# Patient Record
Sex: Female | Born: 1945
Health system: Southern US, Community
[De-identification: ages and names within clinical notes are randomized; demographics above are authoritative.]

## PROBLEM LIST (undated history)

## (undated) DIAGNOSIS — K219 Gastro-esophageal reflux disease without esophagitis: Secondary | ICD-10-CM

## (undated) DIAGNOSIS — I10 Essential (primary) hypertension: Secondary | ICD-10-CM

## (undated) DIAGNOSIS — E785 Hyperlipidemia, unspecified: Secondary | ICD-10-CM

## (undated) DIAGNOSIS — E039 Hypothyroidism, unspecified: Secondary | ICD-10-CM

## (undated) DIAGNOSIS — Z9889 Other specified postprocedural states: Secondary | ICD-10-CM

## (undated) DIAGNOSIS — R112 Nausea with vomiting, unspecified: Secondary | ICD-10-CM

## (undated) HISTORY — PX: CHOLECYSTECTOMY: SHX55

## (undated) HISTORY — PX: NASAL SEPTUM SURGERY: SHX37

---

## 1998-05-05 ENCOUNTER — Other Ambulatory Visit: Admission: RE | Admit: 1998-05-05 | Discharge: 1998-05-05 | Payer: Self-pay | Admitting: *Deleted

## 2000-06-01 ENCOUNTER — Encounter: Admission: RE | Admit: 2000-06-01 | Discharge: 2000-06-01 | Payer: Self-pay | Admitting: Internal Medicine

## 2000-06-01 ENCOUNTER — Encounter: Payer: Self-pay | Admitting: Internal Medicine

## 2000-12-21 ENCOUNTER — Encounter: Admission: RE | Admit: 2000-12-21 | Discharge: 2000-12-21 | Payer: Self-pay | Admitting: Internal Medicine

## 2000-12-21 ENCOUNTER — Encounter: Payer: Self-pay | Admitting: Internal Medicine

## 2001-05-23 ENCOUNTER — Encounter: Admission: RE | Admit: 2001-05-23 | Discharge: 2001-08-21 | Payer: Self-pay | Admitting: Internal Medicine

## 2001-06-05 ENCOUNTER — Encounter: Payer: Self-pay | Admitting: Internal Medicine

## 2001-06-05 ENCOUNTER — Encounter: Admission: RE | Admit: 2001-06-05 | Discharge: 2001-06-05 | Payer: Self-pay | Admitting: Internal Medicine

## 2002-06-07 ENCOUNTER — Encounter: Admission: RE | Admit: 2002-06-07 | Discharge: 2002-06-07 | Payer: Self-pay | Admitting: Internal Medicine

## 2002-06-07 ENCOUNTER — Encounter: Payer: Self-pay | Admitting: Internal Medicine

## 2003-02-10 ENCOUNTER — Other Ambulatory Visit: Admission: RE | Admit: 2003-02-10 | Discharge: 2003-02-10 | Payer: Self-pay | Admitting: Internal Medicine

## 2003-07-29 ENCOUNTER — Encounter: Admission: RE | Admit: 2003-07-29 | Discharge: 2003-07-29 | Payer: Self-pay | Admitting: Internal Medicine

## 2004-09-21 ENCOUNTER — Ambulatory Visit (HOSPITAL_COMMUNITY): Admission: RE | Admit: 2004-09-21 | Discharge: 2004-09-21 | Payer: Self-pay | Admitting: Gastroenterology

## 2004-10-04 ENCOUNTER — Encounter: Admission: RE | Admit: 2004-10-04 | Discharge: 2004-10-04 | Payer: Self-pay | Admitting: Internal Medicine

## 2004-10-06 ENCOUNTER — Other Ambulatory Visit: Admission: RE | Admit: 2004-10-06 | Discharge: 2004-10-06 | Payer: Self-pay | Admitting: Internal Medicine

## 2005-10-03 ENCOUNTER — Other Ambulatory Visit: Admission: RE | Admit: 2005-10-03 | Discharge: 2005-10-03 | Payer: Self-pay | Admitting: Internal Medicine

## 2006-01-06 ENCOUNTER — Encounter: Admission: RE | Admit: 2006-01-06 | Discharge: 2006-01-06 | Payer: Self-pay | Admitting: Internal Medicine

## 2007-05-02 ENCOUNTER — Encounter: Admission: RE | Admit: 2007-05-02 | Discharge: 2007-05-02 | Payer: Self-pay | Admitting: *Deleted

## 2008-01-11 ENCOUNTER — Other Ambulatory Visit: Admission: RE | Admit: 2008-01-11 | Discharge: 2008-01-11 | Payer: Self-pay | Admitting: Internal Medicine

## 2008-05-09 ENCOUNTER — Encounter: Admission: RE | Admit: 2008-05-09 | Discharge: 2008-05-09 | Payer: Self-pay | Admitting: Internal Medicine

## 2009-06-11 ENCOUNTER — Encounter: Admission: RE | Admit: 2009-06-11 | Discharge: 2009-06-11 | Payer: Self-pay | Admitting: Internal Medicine

## 2009-10-08 ENCOUNTER — Observation Stay (HOSPITAL_COMMUNITY): Admission: EM | Admit: 2009-10-08 | Discharge: 2009-10-11 | Payer: Self-pay | Admitting: Emergency Medicine

## 2010-08-15 ENCOUNTER — Encounter: Payer: Self-pay | Admitting: Internal Medicine

## 2010-08-20 ENCOUNTER — Other Ambulatory Visit: Payer: Self-pay | Admitting: Internal Medicine

## 2010-08-20 ENCOUNTER — Other Ambulatory Visit (HOSPITAL_COMMUNITY)
Admission: RE | Admit: 2010-08-20 | Discharge: 2010-08-20 | Disposition: A | Discharge status: Home / Self Care | Payer: 59 | Source: Ambulatory Visit | Attending: Family Medicine | Admitting: Family Medicine

## 2010-08-20 DIAGNOSIS — Z01419 Encounter for gynecological examination (general) (routine) without abnormal findings: Secondary | ICD-10-CM | POA: Insufficient documentation

## 2010-08-30 ENCOUNTER — Other Ambulatory Visit (HOSPITAL_COMMUNITY)
Admission: RE | Admit: 2010-08-30 | Discharge: 2010-08-30 | Disposition: A | Discharge status: Home / Self Care | Payer: Self-pay | Source: Ambulatory Visit | Attending: Internal Medicine | Admitting: Internal Medicine

## 2010-08-30 DIAGNOSIS — Z1159 Encounter for screening for other viral diseases: Secondary | ICD-10-CM | POA: Insufficient documentation

## 2010-10-18 LAB — GLUCOSE, CAPILLARY
Glucose-Capillary: 144 mg/dL — ABNORMAL HIGH (ref 70–99)
Glucose-Capillary: 144 mg/dL — ABNORMAL HIGH (ref 70–99)
Glucose-Capillary: 146 mg/dL — ABNORMAL HIGH (ref 70–99)
Glucose-Capillary: 162 mg/dL — ABNORMAL HIGH (ref 70–99)
Glucose-Capillary: 221 mg/dL — ABNORMAL HIGH (ref 70–99)

## 2010-10-18 LAB — LIPID PANEL
HDL: 32 mg/dL — ABNORMAL LOW
Total CHOL/HDL Ratio: 5.4 ratio
Triglycerides: 205 mg/dL — ABNORMAL HIGH
VLDL: 41 mg/dL — ABNORMAL HIGH (ref 0–40)

## 2010-10-18 LAB — FOLATE: Folate: >20 ng/mL

## 2010-10-18 LAB — COMPREHENSIVE METABOLIC PANEL WITH GFR
ALT: 15 U/L (ref 0–35)
ALT: 16 U/L (ref 0–35)
AST: 14 U/L (ref 0–37)
AST: 18 U/L (ref 0–37)
Albumin: 3.2 g/dL — ABNORMAL LOW (ref 3.5–5.2)
Albumin: 4 g/dL (ref 3.5–5.2)
Alkaline Phosphatase: 61 U/L (ref 39–117)
Alkaline Phosphatase: 79 U/L (ref 39–117)
BUN: 41 mg/dL — ABNORMAL HIGH (ref 6–23)
BUN: 47 mg/dL — ABNORMAL HIGH (ref 6–23)
CO2: 19 meq/L (ref 19–32)
CO2: 22 meq/L (ref 19–32)
Calcium: 8.7 mg/dL (ref 8.4–10.5)
Calcium: 9.7 mg/dL (ref 8.4–10.5)
Chloride: 105 meq/L (ref 96–112)
Chloride: 97 meq/L (ref 96–112)
Creatinine, Ser: 2.05 mg/dL — ABNORMAL HIGH (ref 0.4–1.2)
Creatinine, Ser: 2.67 mg/dL — ABNORMAL HIGH (ref 0.4–1.2)
GFR calc non Af Amer: 18 mL/min — ABNORMAL LOW
GFR calc non Af Amer: 24 mL/min — ABNORMAL LOW
Glucose, Bld: 130 mg/dL — ABNORMAL HIGH (ref 70–99)
Glucose, Bld: 224 mg/dL — ABNORMAL HIGH (ref 70–99)
Potassium: 4.1 meq/L (ref 3.5–5.1)
Potassium: 5 meq/L (ref 3.5–5.1)
Sodium: 131 meq/L — ABNORMAL LOW (ref 135–145)
Sodium: 136 meq/L (ref 135–145)
Total Bilirubin: 0.6 mg/dL (ref 0.3–1.2)
Total Bilirubin: 0.7 mg/dL (ref 0.3–1.2)
Total Protein: 6.5 g/dL (ref 6.0–8.3)
Total Protein: 8.1 g/dL (ref 6.0–8.3)

## 2010-10-18 LAB — CBC
HCT: 30.5 % — ABNORMAL LOW (ref 36.0–46.0)
HCT: 34.1 % — ABNORMAL LOW (ref 36.0–46.0)
Hemoglobin: 10.5 g/dL — ABNORMAL LOW (ref 12.0–15.0)
Hemoglobin: 11.7 g/dL — ABNORMAL LOW (ref 12.0–15.0)
MCHC: 34.3 g/dL (ref 30.0–36.0)
MCHC: 34.4 g/dL (ref 30.0–36.0)
MCV: 89.6 fL (ref 78.0–100.0)
MCV: 89.7 fL (ref 78.0–100.0)
Platelets: 221 K/uL (ref 150–400)
RBC: 3.41 MIL/uL — ABNORMAL LOW (ref 3.87–5.11)
RBC: 3.8 MIL/uL — ABNORMAL LOW (ref 3.87–5.11)
RDW: 13.5 % (ref 11.5–15.5)
RDW: 13.5 % (ref 11.5–15.5)
WBC: 7.8 K/uL (ref 4.0–10.5)
WBC: 9.9 10*3/uL (ref 4.0–10.5)

## 2010-10-18 LAB — FERRITIN: Ferritin: 191 ng/mL (ref 10–291)

## 2010-10-18 LAB — BASIC METABOLIC PANEL
CO2: 23 mEq/L (ref 19–32)
Chloride: 105 mEq/L (ref 96–112)
Glucose, Bld: 163 mg/dL — ABNORMAL HIGH (ref 70–99)
Potassium: 4.6 mEq/L (ref 3.5–5.1)
Sodium: 137 mEq/L (ref 135–145)

## 2010-10-18 LAB — URINE CULTURE: Colony Count: >=100000

## 2010-10-18 LAB — RETICULOCYTES
RBC.: 3.58 MIL/uL — ABNORMAL LOW (ref 3.87–5.11)
Retic Count, Absolute: 25.1 K/uL (ref 19.0–186.0)
Retic Ct Pct: 0.7 % (ref 0.4–3.1)

## 2010-10-18 LAB — URINE MICROSCOPIC-ADD ON

## 2010-10-18 LAB — HEMOGLOBIN A1C
Hgb A1c MFr Bld: 8.2 % — ABNORMAL HIGH (ref 4.6–6.1)
Mean Plasma Glucose: 189 mg/dL

## 2010-10-18 LAB — URINALYSIS, ROUTINE W REFLEX MICROSCOPIC
Specific Gravity, Urine: 1.016 (ref 1.005–1.030)
Urobilinogen, UA: 0.2 mg/dL (ref 0.0–1.0)

## 2010-10-18 LAB — DIFFERENTIAL
Lymphs Abs: 1.5 10*3/uL (ref 0.7–4.0)
Neutro Abs: 7.4 10*3/uL (ref 1.7–7.7)

## 2010-10-18 LAB — IRON AND TIBC
Iron: 35 ug/dL — ABNORMAL LOW (ref 42–135)
Saturation Ratios: 12 % — ABNORMAL LOW (ref 20–55)
TIBC: 284 ug/dL (ref 250–470)
UIBC: 249 ug/dL

## 2010-10-18 LAB — VITAMIN B12: Vitamin B-12: 273 pg/mL (ref 211–911)

## 2010-12-10 NOTE — Op Note (Signed)
NAMEKERSTON, LANDECK               ACCOUNT NO.:  [id number]   MEDICAL RECORD NO.:  [id number]          PATIENT TYPE:  AMB   LOCATION:  ENDO                         FACILITY:  [facility]   PHYSICIAN:  Petra Kuba, M.D.    DATE OF BIRTH:  [birth year]   DATE OF PROCEDURE:  09/21/2004  DATE OF DISCHARGE:                                 OPERATIVE REPORT   PROCEDURE:  Colonoscopy.   INDICATIONS FOR PROCEDURE:  Screening.   CONSENT:  Consent was sighed after the risks, benefits, methods, and options  were thoroughly discussed in the office.   MEDICATIONS GIVEN:  Demerol 80, Versed 7.   DESCRIPTION OF PROCEDURE:  Rectal inspection was pertinent for external  hemorrhoids, small.  Digital exam was negative.  The videocolonoscope was  inserted and easily advanced around the colon to the cecum.  This did  require some abdominal pressure but no position changes.  The cecum was  identified by the appendiceal orifice and the ileocecal valve.  No  abnormality was seen on insertion.  The scope was slowly withdrawn.  On slow  withdrawal through the colon, the prep was adequate.  There was some liquid  stool that required washing and suctioning, but on slow withdrawal back to  the rectum, no abnormalities were seen.  Anorectal pullthrough and  retroflexion confirmed some small hemorrhoids.  The scope was straightened,  air was suctioned, and the scope removed.  The patient tolerated the  procedure well.  There were no obvious immediate complications.   ENDOSCOPIC DIAGNOSES:  1.  Internal and external hemorrhoids.  2.  Otherwise within normal limits to the cecum.   PLAN:  Happy to see back p.r.n.  Yearly rectals and guaiacs per Dr. Daphine Deutscher.  Repeat screening in 5 to 10 years.      MEM/MEDQ  D:  09/21/2004  T:  09/21/2004  Job:  [id number]   cc:   Darius Bump, M.D.  [place]  [place] [address]  Fax: [phone]

## 2011-01-18 ENCOUNTER — Other Ambulatory Visit: Payer: Self-pay | Admitting: Internal Medicine

## 2011-01-18 DIAGNOSIS — Z1231 Encounter for screening mammogram for malignant neoplasm of breast: Secondary | ICD-10-CM

## 2011-01-27 ENCOUNTER — Ambulatory Visit
Admission: RE | Admit: 2011-01-27 | Discharge: 2011-01-27 | Disposition: A | Discharge status: Home / Self Care | Payer: 59 | Source: Ambulatory Visit | Attending: Internal Medicine | Admitting: Internal Medicine

## 2011-01-27 DIAGNOSIS — Z1231 Encounter for screening mammogram for malignant neoplasm of breast: Secondary | ICD-10-CM

## 2011-05-16 ENCOUNTER — Other Ambulatory Visit: Payer: Self-pay | Admitting: Orthopaedic Surgery

## 2011-05-16 DIAGNOSIS — M545 Low back pain: Secondary | ICD-10-CM

## 2011-05-16 DIAGNOSIS — M79605 Pain in left leg: Secondary | ICD-10-CM

## 2011-05-17 ENCOUNTER — Ambulatory Visit
Admission: RE | Admit: 2011-05-17 | Discharge: 2011-05-17 | Disposition: A | Discharge status: Home / Self Care | Payer: 59 | Source: Ambulatory Visit | Attending: Orthopaedic Surgery | Admitting: Orthopaedic Surgery

## 2011-05-17 DIAGNOSIS — M79605 Pain in left leg: Secondary | ICD-10-CM

## 2011-05-17 DIAGNOSIS — M545 Low back pain: Secondary | ICD-10-CM

## 2011-05-31 ENCOUNTER — Encounter (HOSPITAL_COMMUNITY): Payer: Self-pay

## 2011-06-01 ENCOUNTER — Encounter (HOSPITAL_COMMUNITY): Payer: Self-pay | Admitting: Pharmacy Technician

## 2011-06-01 ENCOUNTER — Encounter (HOSPITAL_COMMUNITY): Payer: Self-pay

## 2011-06-01 ENCOUNTER — Encounter (HOSPITAL_COMMUNITY)
Admission: RE | Admit: 2011-06-01 | Discharge: 2011-06-01 | Disposition: A | Discharge status: Home / Self Care | Payer: 59 | Source: Ambulatory Visit | Attending: Neurosurgery | Admitting: Neurosurgery

## 2011-06-01 ENCOUNTER — Other Ambulatory Visit: Payer: Self-pay

## 2011-06-01 ENCOUNTER — Ambulatory Visit (HOSPITAL_COMMUNITY)
Admission: RE | Admit: 2011-06-01 | Discharge: 2011-06-01 | Disposition: A | Discharge status: Home / Self Care | Payer: 59 | Source: Ambulatory Visit | Attending: Neurosurgery | Admitting: Neurosurgery

## 2011-06-01 HISTORY — DX: Hypothyroidism, unspecified: E03.9

## 2011-06-01 HISTORY — DX: Nausea with vomiting, unspecified: R11.2

## 2011-06-01 HISTORY — DX: Gastro-esophageal reflux disease without esophagitis: K21.9

## 2011-06-01 HISTORY — DX: Essential (primary) hypertension: I10

## 2011-06-01 HISTORY — DX: Hyperlipidemia, unspecified: E78.5

## 2011-06-01 HISTORY — DX: Other specified postprocedural states: Z98.890

## 2011-06-01 LAB — BASIC METABOLIC PANEL
CO2: 24 mEq/L (ref 19–32)
Calcium: 10.3 mg/dL (ref 8.4–10.5)
Creatinine, Ser: 0.77 mg/dL (ref 0.50–1.10)

## 2011-06-01 LAB — CBC
MCV: 91.5 fL (ref 78.0–100.0)
Platelets: 269 10*3/uL (ref 150–400)
RDW: 13.6 % (ref 11.5–15.5)
WBC: 6.3 10*3/uL (ref 4.0–10.5)

## 2011-06-01 LAB — TYPE AND SCREEN

## 2011-06-01 MED ORDER — CEFAZOLIN SODIUM 1-5 GM-% IV SOLN
1.0000 g | INTRAVENOUS | Status: DC
  Filled 2011-06-01: qty 50

## 2011-06-01 NOTE — Pre-Procedure Instructions (Signed)
20 Belina A Jorstad  06/01/2011   Your procedure is scheduled on:  Jun 02, 2011 Thursday  Report to [facility] at 1030 AM.  Call this number if you have problems the morning of surgery: [phone]   Remember:   Do not eat food:After Midnight.  Do not drink clear liquids: 4 Hours before arrival.  Take these medicines the morning of surgery with A SIP OF WATER: atenolol, hydrocodone, levothyroxin   Do not wear jewelry, make-up or nail polish.  Do not wear lotions, powders, or perfumes. You may wear deodorant.  Do not shave 48 hours prior to surgery.  Do not bring valuables to the hospital.  Contacts, dentures or bridgework may not be worn into surgery.  Leave suitcase in the car. After surgery it may be brought to your room.  For patients admitted to the hospital, checkout time is 11:00 AM the day of discharge.   Patients discharged the day of surgery will not be allowed to drive home.  Name and phone number of your driver: NA  Special Instructions: CHG Shower Use Special Wash: 1/2 bottle night before surgery and 1/2 bottle morning of surgery.   Please read over the following fact sheets that you were given: Pain Booklet and Surgical Site Infection Prevention

## 2011-06-02 ENCOUNTER — Encounter (HOSPITAL_COMMUNITY): Payer: Self-pay | Admitting: Certified Registered"

## 2011-06-02 ENCOUNTER — Ambulatory Visit (HOSPITAL_COMMUNITY): Payer: 59

## 2011-06-02 ENCOUNTER — Ambulatory Visit (HOSPITAL_COMMUNITY)
Admission: RE | Admit: 2011-06-02 | Discharge: 2011-06-03 | Disposition: A | Discharge status: Home / Self Care | Payer: 59 | Source: Ambulatory Visit | Attending: Neurosurgery | Admitting: Neurosurgery

## 2011-06-02 ENCOUNTER — Encounter (HOSPITAL_COMMUNITY): Payer: Self-pay | Admitting: *Deleted

## 2011-06-02 ENCOUNTER — Encounter (HOSPITAL_COMMUNITY)
Admission: RE | Disposition: A | Discharge status: Home / Self Care | Payer: Self-pay | Source: Ambulatory Visit | Attending: Neurosurgery

## 2011-06-02 ENCOUNTER — Ambulatory Visit (HOSPITAL_COMMUNITY): Payer: 59 | Admitting: Certified Registered"

## 2011-06-02 DIAGNOSIS — E119 Type 2 diabetes mellitus without complications: Secondary | ICD-10-CM | POA: Insufficient documentation

## 2011-06-02 DIAGNOSIS — Z01818 Encounter for other preprocedural examination: Secondary | ICD-10-CM | POA: Insufficient documentation

## 2011-06-02 DIAGNOSIS — Z01810 Encounter for preprocedural cardiovascular examination: Secondary | ICD-10-CM | POA: Insufficient documentation

## 2011-06-02 DIAGNOSIS — M5106 Intervertebral disc disorders with myelopathy, lumbar region: Secondary | ICD-10-CM | POA: Insufficient documentation

## 2011-06-02 DIAGNOSIS — K219 Gastro-esophageal reflux disease without esophagitis: Secondary | ICD-10-CM | POA: Insufficient documentation

## 2011-06-02 DIAGNOSIS — I10 Essential (primary) hypertension: Secondary | ICD-10-CM | POA: Insufficient documentation

## 2011-06-02 DIAGNOSIS — Z01812 Encounter for preprocedural laboratory examination: Secondary | ICD-10-CM | POA: Insufficient documentation

## 2011-06-02 DIAGNOSIS — E039 Hypothyroidism, unspecified: Secondary | ICD-10-CM | POA: Insufficient documentation

## 2011-06-02 HISTORY — PX: LUMBAR LAMINECTOMY/DECOMPRESSION MICRODISCECTOMY: SHX5026

## 2011-06-02 LAB — URINALYSIS, ROUTINE W REFLEX MICROSCOPIC
Glucose, UA: NEGATIVE mg/dL
Ketones, ur: NEGATIVE mg/dL
Protein, ur: NEGATIVE mg/dL
Urobilinogen, UA: 0.2 mg/dL (ref 0.0–1.0)

## 2011-06-02 LAB — URINE MICROSCOPIC-ADD ON

## 2011-06-02 LAB — GLUCOSE, CAPILLARY: Glucose-Capillary: 213 mg/dL — ABNORMAL HIGH (ref 70–99)

## 2011-06-02 LAB — APTT: aPTT: 26 seconds (ref 24–37)

## 2011-06-02 LAB — PROTIME-INR: Prothrombin Time: 13.1 seconds (ref 11.6–15.2)

## 2011-06-02 SURGERY — LUMBAR LAMINECTOMY/DECOMPRESSION MICRODISCECTOMY
Anesthesia: General | Laterality: Left

## 2011-06-02 MED ORDER — DOCUSATE SODIUM 100 MG PO CAPS: 100.0000 mg | ORAL_CAPSULE | Freq: Two times a day (BID) | ORAL | Status: DC

## 2011-06-02 MED ORDER — KCL IN DEXTROSE-NACL 20-5-0.45 MEQ/L-%-% IV SOLN
INTRAVENOUS | Status: DC
  Administered 2011-06-02: 18:00:00 via INTRAVENOUS
  Filled 2011-06-02 (×2): qty 1000

## 2011-06-02 MED ORDER — PHENOL 1.4 % MT LIQD: 1.0000 | OROMUCOSAL | Status: DC | PRN

## 2011-06-02 MED ORDER — PROMETHAZINE HCL 25 MG PO TABS: 25.0000 mg | ORAL_TABLET | Freq: Four times a day (QID) | ORAL | Status: DC | PRN

## 2011-06-02 MED ORDER — HYDROCODONE-ACETAMINOPHEN 5-325 MG PO TABS
1.0000 | ORAL_TABLET | ORAL | Status: DC | PRN
  Administered 2011-06-03: 2 via ORAL
  Filled 2011-06-02: qty 2

## 2011-06-02 MED ORDER — PROMETHAZINE HCL 25 MG/ML IJ SOLN: 6.2500 mg | INTRAMUSCULAR | Status: DC | PRN

## 2011-06-02 MED ORDER — SUCCINYLCHOLINE CHLORIDE 20 MG/ML IJ SOLN
INTRAMUSCULAR | Status: DC | PRN
  Administered 2011-06-02: 100 mg via INTRAVENOUS

## 2011-06-02 MED ORDER — MIDAZOLAM HCL 5 MG/5ML IJ SOLN
INTRAMUSCULAR | Status: DC | PRN
  Administered 2011-06-02: 2 mg via INTRAVENOUS

## 2011-06-02 MED ORDER — PHENYLEPHRINE HCL 10 MG/ML IJ SOLN
INTRAMUSCULAR | Status: DC | PRN
  Administered 2011-06-02: 40 ug via INTRAVENOUS

## 2011-06-02 MED ORDER — SODIUM CHLORIDE 0.9 % IR SOLN
Status: DC | PRN
  Administered 2011-06-02: 16:00:00

## 2011-06-02 MED ORDER — ATENOLOL 25 MG PO TABS
25.0000 mg | ORAL_TABLET | Freq: Every day | ORAL | Status: DC
  Administered 2011-06-03: 25 mg via ORAL
  Filled 2011-06-02 (×2): qty 1

## 2011-06-02 MED ORDER — KETOROLAC TROMETHAMINE 30 MG/ML IJ SOLN
30.0000 mg | Freq: Four times a day (QID) | INTRAMUSCULAR | Status: DC
  Administered 2011-06-03 (×2): 30 mg via INTRAVENOUS
  Filled 2011-06-02 (×3): qty 1

## 2011-06-02 MED ORDER — GLIPIZIDE 10 MG PO TABS
10.0000 mg | ORAL_TABLET | Freq: Two times a day (BID) | ORAL | Status: DC
  Administered 2011-06-03: 10 mg via ORAL
  Filled 2011-06-02 (×3): qty 1

## 2011-06-02 MED ORDER — CEFAZOLIN SODIUM 1-5 GM-% IV SOLN
INTRAVENOUS | Status: DC | PRN
  Administered 2011-06-02: 2 g via INTRAVENOUS

## 2011-06-02 MED ORDER — ONDANSETRON HCL 4 MG/2ML IJ SOLN
INTRAMUSCULAR | Status: DC | PRN
  Administered 2011-06-02: 4 mg via INTRAVENOUS

## 2011-06-02 MED ORDER — INSULIN ASPART 100 UNIT/ML ~~LOC~~ SOLN
0.0000 [IU] | SUBCUTANEOUS | Status: DC
Start: 2011-06-02 — End: 2011-06-03
  Administered 2011-06-02: 3 [IU] via SUBCUTANEOUS
  Administered 2011-06-03 (×3): 5 [IU] via SUBCUTANEOUS
  Filled 2011-06-02: qty 3

## 2011-06-02 MED ORDER — SODIUM CHLORIDE 0.9 % IJ SOLN: 3.0000 mL | Freq: Two times a day (BID) | INTRAMUSCULAR | Status: DC

## 2011-06-02 MED ORDER — LIDOCAINE-EPINEPHRINE 1 %-1:100000 IJ SOLN
INTRAMUSCULAR | Status: DC | PRN
  Administered 2011-06-02: 20 mL

## 2011-06-02 MED ORDER — PROPOFOL 10 MG/ML IV EMUL
INTRAVENOUS | Status: DC | PRN
  Administered 2011-06-02: 120 mg via INTRAVENOUS

## 2011-06-02 MED ORDER — OXYCODONE-ACETAMINOPHEN 5-325 MG PO TABS: 1.0000 | ORAL_TABLET | ORAL | Status: DC | PRN

## 2011-06-02 MED ORDER — LIDOCAINE HCL (CARDIAC) 20 MG/ML IV SOLN
INTRAVENOUS | Status: DC | PRN
  Administered 2011-06-02: 100 mg via INTRAVENOUS

## 2011-06-02 MED ORDER — ONDANSETRON HCL 4 MG/2ML IJ SOLN
4.0000 mg | INTRAMUSCULAR | Status: DC | PRN
  Administered 2011-06-02: 4 mg via INTRAVENOUS
  Filled 2011-06-02: qty 2

## 2011-06-02 MED ORDER — SODIUM CHLORIDE 0.9 % IJ SOLN: 3.0000 mL | INTRAMUSCULAR | Status: DC | PRN

## 2011-06-02 MED ORDER — FENTANYL CITRATE 0.05 MG/ML IJ SOLN
INTRAMUSCULAR | Status: DC | PRN
  Administered 2011-06-02 (×3): 50 ug via INTRAVENOUS

## 2011-06-02 MED ORDER — MORPHINE SULFATE 4 MG/ML IJ SOLN
1.0000 mg | INTRAMUSCULAR | Status: DC | PRN
  Administered 2011-06-02: 2 mg via INTRAVENOUS
  Filled 2011-06-02: qty 1

## 2011-06-02 MED ORDER — PAROXETINE HCL 20 MG PO TABS
40.0000 mg | ORAL_TABLET | Freq: Every day | ORAL | Status: DC
  Administered 2011-06-02 – 2011-06-03 (×2): 40 mg via ORAL
  Filled 2011-06-02 (×2): qty 2

## 2011-06-02 MED ORDER — LOSARTAN POTASSIUM 50 MG PO TABS
100.0000 mg | ORAL_TABLET | Freq: Every day | ORAL | Status: DC
  Administered 2011-06-02 – 2011-06-03 (×2): 100 mg via ORAL
  Filled 2011-06-02 (×2): qty 2

## 2011-06-02 MED ORDER — METFORMIN HCL 500 MG PO TABS
1000.0000 mg | ORAL_TABLET | Freq: Two times a day (BID) | ORAL | Status: DC
  Administered 2011-06-03: 1000 mg via ORAL
  Filled 2011-06-02 (×3): qty 2

## 2011-06-02 MED ORDER — METHOCARBAMOL 100 MG/ML IJ SOLN
500.0000 mg | Freq: Four times a day (QID) | INTRAVENOUS | Status: DC | PRN
  Filled 2011-06-02: qty 5

## 2011-06-02 MED ORDER — LORAZEPAM 0.5 MG PO TABS: 0.5000 mg | ORAL_TABLET | Freq: Every day | ORAL | Status: DC | PRN

## 2011-06-02 MED ORDER — MENTHOL 3 MG MT LOZG: 1.0000 | LOZENGE | OROMUCOSAL | Status: DC | PRN

## 2011-06-02 MED ORDER — THROMBIN 5000 UNITS EX KIT
PACK | CUTANEOUS | Status: DC | PRN
  Administered 2011-06-02: 5000 [IU] via TOPICAL

## 2011-06-02 MED ORDER — NEOSTIGMINE METHYLSULFATE 1 MG/ML IJ SOLN
INTRAMUSCULAR | Status: DC | PRN
  Administered 2011-06-02: 3 mg via INTRAVENOUS

## 2011-06-02 MED ORDER — ACETAMINOPHEN 325 MG PO TABS: 650.0000 mg | ORAL_TABLET | ORAL | Status: DC | PRN

## 2011-06-02 MED ORDER — HYDROMORPHONE HCL PF 1 MG/ML IJ SOLN: 0.2500 mg | INTRAMUSCULAR | Status: DC | PRN

## 2011-06-02 MED ORDER — PANTOPRAZOLE SODIUM 40 MG PO TBEC: 40.0000 mg | DELAYED_RELEASE_TABLET | Freq: Every day | ORAL | Status: DC

## 2011-06-02 MED ORDER — LEVOTHYROXINE SODIUM 112 MCG PO TABS
112.0000 ug | ORAL_TABLET | Freq: Every day | ORAL | Status: DC
  Administered 2011-06-03: 112 ug via ORAL
  Filled 2011-06-02 (×2): qty 1

## 2011-06-02 MED ORDER — EPHEDRINE SULFATE 50 MG/ML IJ SOLN
INTRAMUSCULAR | Status: DC | PRN
  Administered 2011-06-02: 25 mg via INTRAVENOUS

## 2011-06-02 MED ORDER — SODIUM CHLORIDE 0.9 % IV SOLN: 250.0000 mL | INTRAVENOUS | Status: DC

## 2011-06-02 MED ORDER — ROSUVASTATIN CALCIUM 5 MG PO TABS
5.0000 mg | ORAL_TABLET | ORAL | Status: DC
  Filled 2011-06-02: qty 1

## 2011-06-02 MED ORDER — CEFAZOLIN SODIUM 1-5 GM-% IV SOLN
1.0000 g | Freq: Three times a day (TID) | INTRAVENOUS | Status: AC
  Administered 2011-06-02 – 2011-06-03 (×2): 1 g via INTRAVENOUS
  Filled 2011-06-02 (×2): qty 50

## 2011-06-02 MED ORDER — PIOGLITAZONE HCL 45 MG PO TABS
45.0000 mg | ORAL_TABLET | Freq: Every day | ORAL | Status: DC
  Administered 2011-06-03: 45 mg via ORAL
  Filled 2011-06-02 (×2): qty 1

## 2011-06-02 MED ORDER — ROCURONIUM BROMIDE 100 MG/10ML IV SOLN
INTRAVENOUS | Status: DC | PRN
  Administered 2011-06-02: 50 mg via INTRAVENOUS

## 2011-06-02 MED ORDER — LACTATED RINGERS IV SOLN
INTRAVENOUS | Status: DC | PRN
  Administered 2011-06-02 (×2): via INTRAVENOUS

## 2011-06-02 MED ORDER — METFORMIN HCL 500 MG PO TABS: 1000.0000 mg | ORAL_TABLET | Freq: Two times a day (BID) | ORAL | Status: DC

## 2011-06-02 MED ORDER — CEFAZOLIN SODIUM-DEXTROSE 2-3 GM-% IV SOLR
2.0000 g | INTRAVENOUS | Status: DC
  Filled 2011-06-02: qty 50

## 2011-06-02 MED ORDER — GLYCOPYRROLATE 0.2 MG/ML IJ SOLN
INTRAMUSCULAR | Status: DC | PRN
  Administered 2011-06-02: .5 mg via INTRAVENOUS

## 2011-06-02 MED ORDER — ACETAMINOPHEN 650 MG RE SUPP: 650.0000 mg | RECTAL | Status: DC | PRN

## 2011-06-02 MED ORDER — METHOCARBAMOL 500 MG PO TABS: 500.0000 mg | ORAL_TABLET | Freq: Four times a day (QID) | ORAL | Status: DC | PRN

## 2011-06-02 SURGICAL SUPPLY — 57 items
APL SKNCLS STERI-STRIP NONHPOA (GAUZE/BANDAGES/DRESSINGS) ×1
BAG DECANTER FOR FLEXI CONT (MISCELLANEOUS) ×2 IMPLANT
BENZOIN TINCTURE PRP APPL 2/3 (GAUZE/BANDAGES/DRESSINGS) ×1 IMPLANT
BLADE SURG ROTATE 9660 (MISCELLANEOUS) IMPLANT
BUR ACORN 6.0 ACORN (BURR) ×2 IMPLANT
BUR ACRON 5.0MM COATED (BURR) ×1 IMPLANT
CANISTER SUCTION 2500CC (MISCELLANEOUS) ×2 IMPLANT
CLOTH BEACON ORANGE TIMEOUT ST (SAFETY) ×2 IMPLANT
CONT SPEC 4OZ CLIKSEAL STRL BL (MISCELLANEOUS) ×1 IMPLANT
DRAPE LAPAROTOMY 100X72X124 (DRAPES) ×2 IMPLANT
DRAPE MICROSCOPE LEICA (MISCELLANEOUS) ×2 IMPLANT
DRAPE POUCH INSTRU U-SHP 10X18 (DRAPES) ×2 IMPLANT
DRESSING TELFA 8X3 (GAUZE/BANDAGES/DRESSINGS) ×1 IMPLANT
DURAPREP 26ML APPLICATOR (WOUND CARE) ×2 IMPLANT
ELECT REM PT RETURN 9FT ADLT (ELECTROSURGICAL) ×2
ELECTRODE REM PT RTRN 9FT ADLT (ELECTROSURGICAL) ×1 IMPLANT
GAUZE SPONGE 4X4 16PLY XRAY LF (GAUZE/BANDAGES/DRESSINGS) IMPLANT
GLOVE BIO SURGEON STRL SZ8 (GLOVE) ×1 IMPLANT
GLOVE BIOGEL PI IND STRL 7.5 (GLOVE) IMPLANT
GLOVE BIOGEL PI IND STRL 8.5 (GLOVE) IMPLANT
GLOVE BIOGEL PI INDICATOR 7.5 (GLOVE) ×2
GLOVE BIOGEL PI INDICATOR 8.5 (GLOVE) ×1
GLOVE ECLIPSE 7.5 STRL STRAW (GLOVE) ×5 IMPLANT
GLOVE EXAM NITRILE LRG STRL (GLOVE) IMPLANT
GLOVE EXAM NITRILE MD LF STRL (GLOVE) IMPLANT
GLOVE EXAM NITRILE XL STR (GLOVE) IMPLANT
GLOVE EXAM NITRILE XS STR PU (GLOVE) IMPLANT
GOWN BRE IMP SLV AUR LG STRL (GOWN DISPOSABLE) ×3 IMPLANT
GOWN BRE IMP SLV AUR XL STRL (GOWN DISPOSABLE) ×2 IMPLANT
GOWN STRL REIN 2XL LVL4 (GOWN DISPOSABLE) IMPLANT
KIT BASIN OR (CUSTOM PROCEDURE TRAY) ×2 IMPLANT
KIT ROOM TURNOVER OR (KITS) ×2 IMPLANT
NDL HYPO 18GX1.5 BLUNT FILL (NEEDLE) IMPLANT
NDL SPNL 18GX3.5 QUINCKE PK (NEEDLE) IMPLANT
NEEDLE HYPO 18GX1.5 BLUNT FILL (NEEDLE) IMPLANT
NEEDLE HYPO 22GX1.5 SAFETY (NEEDLE) ×4 IMPLANT
NEEDLE SPNL 18GX3.5 QUINCKE PK (NEEDLE) ×2 IMPLANT
NS IRRIG 1000ML POUR BTL (IV SOLUTION) ×2 IMPLANT
PACK LAMINECTOMY NEURO (CUSTOM PROCEDURE TRAY) ×2 IMPLANT
PAD ARMBOARD 7.5X6 YLW CONV (MISCELLANEOUS) ×8 IMPLANT
PATTIES SURGICAL .75X.75 (GAUZE/BANDAGES/DRESSINGS) ×1 IMPLANT
RUBBERBAND STERILE (MISCELLANEOUS) ×4 IMPLANT
SPONGE GAUZE 4X4 12PLY (GAUZE/BANDAGES/DRESSINGS) ×1 IMPLANT
SPONGE LAP 4X18 X RAY DECT (DISPOSABLE) IMPLANT
SPONGE SURGIFOAM ABS GEL SZ50 (HEMOSTASIS) ×2 IMPLANT
STRIP CLOSURE SKIN 1/2X4 (GAUZE/BANDAGES/DRESSINGS) ×1 IMPLANT
SUT PROLENE 6 0 BV (SUTURE) IMPLANT
SUT VIC AB 0 CT1 18XCR BRD8 (SUTURE) ×1 IMPLANT
SUT VIC AB 0 CT1 8-18 (SUTURE) ×2
SUT VIC AB 2-0 CP2 18 (SUTURE) ×2 IMPLANT
SUT VIC AB 3-0 SH 8-18 (SUTURE) ×1 IMPLANT
SYR 20CC LL (SYRINGE) ×2 IMPLANT
SYR 5ML LL (SYRINGE) IMPLANT
TAPE CLOTH SURG 4X10 WHT LF (GAUZE/BANDAGES/DRESSINGS) ×1 IMPLANT
TOWEL OR 17X24 6PK STRL BLUE (TOWEL DISPOSABLE) ×2 IMPLANT
TOWEL OR 17X26 10 PK STRL BLUE (TOWEL DISPOSABLE) ×2 IMPLANT
WATER STERILE IRR 1000ML POUR (IV SOLUTION) ×2 IMPLANT

## 2011-06-02 NOTE — Transfer of Care (Signed)
Immediate Anesthesia Transfer of Care Note  Patient: Sharon Goodman  Procedure(s) Performed:  LUMBAR LAMINECTOMY/DECOMPRESSION MICRODISCECTOMY - Left L3-4 Laminectomy  Patient Location: PACU  Anesthesia Type: General  Level of Consciousness: awake, alert  and oriented  Airway & Oxygen Therapy: Patient Spontanous Breathing and Patient connected to nasal cannula oxygen  Post-op Assessment: Report given to PACU RN, Post -op Vital signs reviewed and stable and Patient moving all extremities  Post vital signs: Reviewed and stable  Complications: No apparent anesthesia complications

## 2011-06-02 NOTE — Op Note (Signed)
06/02/2011  5:33 PM  PATIENT:  Sharon Goodman  65 y.o. female  PRE-OPERATIVE DIAGNOSIS:  lumbar Herniated Nucleous Pulposus with Mylopathy Left Lumbar Three-four  POST-OPERATIVE DIAGNOSIS:  Lumbar Herniated Nucleous Pulposus with Myelopathy left lumbar three-four  PROCEDURE:  Procedure(s): LUMBAR LAMINECTOMY/DECOMPRESSION MICRODISCECTOMY  SURGEON:  Surgeon(s): Clydene Fake  ASSISTANTS: Dr. Venetia Maxon  ANESTHESIA:   general  EBL:  Total I/O In: 1000 [I.V.:1000] Out: -  minimal  BLOOD ADMINISTERED:none  DRAINS: none   SPECIMEN:  No Specimen  DICTATION: Patient is 65 year old right-handed woman who severe back pain, left hip pain and left leg pain, numbness and weakness .  MRI was done showing a large left-sided disc herniation at L3-4 level with the fragment going cephalad. Procedure in detail: Patient was brought into the operating room and general anesthesia induced. Patient was prepped and draped in a sterile fashion. A needle was placed in interspace and x-ray was obtained showing the needle was at the 34 disc space. The site of incision was injected with 20 cc 1% lidocaine with epinephrine. An incision was then made. Incision taken to the fascia with the Bovie the fascia was incised over the L3 and 4 spinous process with the Bovie. Subperiosteal  Dissection over the L3 and 4 spinous process lamina was done self-retaining retractor was placed a marker was placed in the interspace another x-ray was obtained. The x-ray showed the L3-4 interspace. The microscope was brought in for mike dissection at this point. A hemi semi laminectomy was then done with high-speed drill and Kerrison punches. We explored the disc space and found a large free fragment of disc going cephalad and with nerve hooks were removed this decompressed and the L3 nerve root. There was a large annular tearing the disc space the disc space was incised with a 15 blade and discectomy done with pituitary rongeurs and  curettes. We were finished we then again explore the epidural space with good decompression of the central canal and the 3 and IV nerve roots. We had very good hemostasis we irrigated with about solution the retractors were removed. The fascia was closed with 0 Vicryl interrupted sutures subcutaneous tissue closed with the 02 over 0 Vicryl interrupted sutures skin closed with benzoin Steri-Strips. Dressing was placed this was placed back in the supine position woken from anesthesia and transferred to recovery in stable condition.  PLAN OF CARE: Admit for overnight observation  PATIENT DISPOSITION:  PACU - hemodynamically stable.   Delay start of Pharmacological VTE agent (>24hrs) due to surgical blood loss or risk of bleeding:  not applicable

## 2011-06-02 NOTE — H&P (Signed)
See h and p

## 2011-06-02 NOTE — Interval H&P Note (Signed)
History and Physical Interval Note:   06/02/2011   2:24 PM   Sharon Goodman  has presented today for surgery, with the diagnosis of lumbar HNP with Mylopathy  The various methods of treatment have been discussed with the patient and family. After consideration of risks, benefits and other options for treatment, the patient has consented to  Procedure(s): LUMBAR LAMINECTOMY/DECOMPRESSION MICRODISCECTOMY as a surgical intervention .  The patients' history has been reviewed, patient examined, no change in status, stable for surgery.  I have reviewed the patients' chart and labs.  Questions were answered to the patient's satisfaction.     Clydene Fake  MD

## 2011-06-02 NOTE — Anesthesia Preprocedure Evaluation (Addendum)
Anesthesia Evaluation  Patient identified by MRN, date of birth, ID band Patient awake    Reviewed: Allergy & Precautions, H&P , NPO status , Patient's Chart, lab work & pertinent test results  History of Anesthesia Complications (+) PONV and Family history of anesthesia reaction  Airway Mallampati: II TM Distance: >3 FB Neck ROM: Full    Dental   Pulmonary    Pulmonary exam normal       Cardiovascular hypertension,     Neuro/Psych    GI/Hepatic GERD-  Controlled,  Endo/Other  Diabetes mellitus-, Poorly Controlled, Type 2Hypothyroidism Morbid obesity  Renal/GU      Musculoskeletal   Abdominal (+) obese,   Peds  Hematology   Anesthesia Other Findings   Reproductive/Obstetrics                          Anesthesia Physical Anesthesia Plan  ASA: III  Anesthesia Plan: General   Post-op Pain Management:    Induction: Intravenous  Airway Management Planned: Oral ETT  Additional Equipment:   Intra-op Plan:   Post-operative Plan: Extubation in OR  Informed Consent: I have reviewed the patients History and Physical, chart, labs and discussed the procedure including the risks, benefits and alternatives for the proposed anesthesia with the patient or authorized representative who has indicated his/her understanding and acceptance.     Plan Discussed with: CRNA and Surgeon  Anesthesia Plan Comments:         Anesthesia Quick Evaluation Glucose 213 preop  Will cover with novolog insulin Dr Gypsy Balsam

## 2011-06-03 LAB — GLUCOSE, CAPILLARY
Glucose-Capillary: 228 mg/dL — ABNORMAL HIGH (ref 70–99)
Glucose-Capillary: 229 mg/dL — ABNORMAL HIGH (ref 70–99)
Glucose-Capillary: 303 mg/dL — ABNORMAL HIGH (ref 70–99)
Glucose-Capillary: 95 mg/dL (ref 70–99)

## 2011-06-03 MED ORDER — OXYCODONE-ACETAMINOPHEN 5-325 MG PO TABS: 1.0000 | ORAL_TABLET | ORAL | Status: AC | PRN

## 2011-06-03 MED ORDER — CYCLOBENZAPRINE HCL 10 MG PO TABS: 10.0000 mg | ORAL_TABLET | Freq: Three times a day (TID) | ORAL | Status: AC | PRN

## 2011-06-03 NOTE — Discharge Summary (Signed)
  Physician Discharge Summary  Patient ID: Sharon Goodman MRN: [id number] DOB/AGE: 65-03-1946 65 y.o.  Admit date: 06/02/2011 Discharge date: 06/03/2011  Admission Diagnoses:  Discharge Diagnoses:  Active Problems:  * No active hospital problems. *    Discharged Condition: good  Hospital Course: Patient rated 11 8-12 underwent surgery laminectomy and discectomy without complications. She was transferred to the permanent floor there she is ambulating much less leg pain and doing well healing well no nausea vomiting will be discharged home in stable condition. Incision is clean dry intact.  Consults: none  Significant Diagnostic Studies: None  Treatments: surgery: Lumbar discectomy  Discharge Exam: Blood pressure 151/74, pulse 82, temperature 98.2 F (36.8 C), temperature source Oral, resp. rate 20, SpO2 97.00%. Wound: wounds are clean dry intact  Disposition: Home   Current Discharge Medication List    START taking these medications   Details  cyclobenzaprine (FLEXERIL) 10 MG tablet Take 1 tablet (10 mg total) by mouth 3 (three) times daily as needed for muscle spasms. Qty: 50 tablet, Refills: 3    oxyCODONE-acetaminophen (PERCOCET) 5-325 MG per tablet Take 1-2 tablets by mouth every 4 (four) hours as needed for pain. Qty: 41 tablet, Refills: 0      CONTINUE these medications which have NOT CHANGED   Details  atenolol (TENORMIN) 25 MG tablet Take 25 mg by mouth daily.      glipiZIDE (GLUCOTROL) 10 MG tablet Take 10 mg by mouth 2 (two) times daily.      HYDROcodone-acetaminophen (NORCO) 5-325 MG per tablet Take 1 tablet by mouth daily as needed. For pain     levothyroxine (SYNTHROID, LEVOTHROID) 112 MCG tablet Take 112 mcg by mouth daily.      losartan (COZAAR) 50 MG tablet Take 100 mg by mouth daily.      meloxicam (MOBIC) 15 MG tablet Take 15 mg by mouth daily.      metFORMIN (GLUCOPHAGE) 1000 MG tablet Take 1,000 mg by mouth 2 (two) times daily.        omeprazole (PRILOSEC) 20 MG capsule Take 20 mg by mouth daily.      PARoxetine (PAXIL) 40 MG tablet Take 40 mg by mouth daily.      pioglitazone (ACTOS) 45 MG tablet Take 45 mg by mouth daily.      rosuvastatin (CRESTOR) 5 MG tablet Take 5 mg by mouth every other day.      LORazepam (ATIVAN) 0.5 MG tablet Take 0.5 mg by mouth daily as needed. For anxiety          Signed: Clydene Fake, MD 06/03/2011, 9:24 AM

## 2011-06-03 NOTE — Anesthesia Postprocedure Evaluation (Signed)
  Anesthesia Post-op Note  Patient: Sharon Goodman  Procedure(s) Performed:  LUMBAR LAMINECTOMY/DECOMPRESSION MICRODISCECTOMY - Left L3-4 Laminectomy  Patient Location: PACU  Anesthesia Type: General  Level of Consciousness: awake, alert  and oriented  Airway and Oxygen Therapy: Patient Spontanous Breathing  Post-op Pain: mild  Post-op Assessment: Post-op Vital signs reviewed, Patient's Cardiovascular Status Stable, Respiratory Function Stable, Patent Airway, No signs of Nausea or vomiting and Pain level controlled  Post-op Vital Signs: stable  Complications: No apparent anesthesia complications

## 2011-06-08 ENCOUNTER — Encounter (HOSPITAL_COMMUNITY): Payer: Self-pay | Admitting: Neurosurgery

## 2011-07-28 DIAGNOSIS — M6281 Muscle weakness (generalized): Secondary | ICD-10-CM | POA: Diagnosis not present

## 2011-07-28 DIAGNOSIS — M75120 Complete rotator cuff tear or rupture of unspecified shoulder, not specified as traumatic: Secondary | ICD-10-CM | POA: Diagnosis not present

## 2011-08-02 DIAGNOSIS — M6281 Muscle weakness (generalized): Secondary | ICD-10-CM | POA: Diagnosis not present

## 2011-08-02 DIAGNOSIS — M75120 Complete rotator cuff tear or rupture of unspecified shoulder, not specified as traumatic: Secondary | ICD-10-CM | POA: Diagnosis not present

## 2011-08-04 DIAGNOSIS — M6281 Muscle weakness (generalized): Secondary | ICD-10-CM | POA: Diagnosis not present

## 2011-08-04 DIAGNOSIS — M75120 Complete rotator cuff tear or rupture of unspecified shoulder, not specified as traumatic: Secondary | ICD-10-CM | POA: Diagnosis not present

## 2011-08-17 DIAGNOSIS — M6281 Muscle weakness (generalized): Secondary | ICD-10-CM | POA: Diagnosis not present

## 2011-08-17 DIAGNOSIS — M75120 Complete rotator cuff tear or rupture of unspecified shoulder, not specified as traumatic: Secondary | ICD-10-CM | POA: Diagnosis not present

## 2011-08-19 DIAGNOSIS — M6281 Muscle weakness (generalized): Secondary | ICD-10-CM | POA: Diagnosis not present

## 2011-08-19 DIAGNOSIS — M75120 Complete rotator cuff tear or rupture of unspecified shoulder, not specified as traumatic: Secondary | ICD-10-CM | POA: Diagnosis not present

## 2011-08-24 DIAGNOSIS — M75120 Complete rotator cuff tear or rupture of unspecified shoulder, not specified as traumatic: Secondary | ICD-10-CM | POA: Diagnosis not present

## 2011-08-24 DIAGNOSIS — M6281 Muscle weakness (generalized): Secondary | ICD-10-CM | POA: Diagnosis not present

## 2011-08-26 DIAGNOSIS — M6281 Muscle weakness (generalized): Secondary | ICD-10-CM | POA: Diagnosis not present

## 2011-08-26 DIAGNOSIS — M75120 Complete rotator cuff tear or rupture of unspecified shoulder, not specified as traumatic: Secondary | ICD-10-CM | POA: Diagnosis not present

## 2011-09-02 DIAGNOSIS — E785 Hyperlipidemia, unspecified: Secondary | ICD-10-CM | POA: Diagnosis not present

## 2011-09-02 DIAGNOSIS — R079 Chest pain, unspecified: Secondary | ICD-10-CM | POA: Diagnosis not present

## 2011-09-02 DIAGNOSIS — K219 Gastro-esophageal reflux disease without esophagitis: Secondary | ICD-10-CM | POA: Diagnosis not present

## 2011-09-02 DIAGNOSIS — E039 Hypothyroidism, unspecified: Secondary | ICD-10-CM | POA: Diagnosis not present

## 2011-09-02 DIAGNOSIS — I10 Essential (primary) hypertension: Secondary | ICD-10-CM | POA: Diagnosis not present

## 2011-09-02 DIAGNOSIS — E119 Type 2 diabetes mellitus without complications: Secondary | ICD-10-CM | POA: Diagnosis not present

## 2011-09-02 DIAGNOSIS — Z23 Encounter for immunization: Secondary | ICD-10-CM | POA: Diagnosis not present

## 2011-09-02 DIAGNOSIS — Z0000 Encounter for general adult medical examination without abnormal findings: Secondary | ICD-10-CM | POA: Diagnosis not present

## 2011-09-16 DIAGNOSIS — M4716 Other spondylosis with myelopathy, lumbar region: Secondary | ICD-10-CM | POA: Diagnosis not present

## 2011-09-16 DIAGNOSIS — M5106 Intervertebral disc disorders with myelopathy, lumbar region: Secondary | ICD-10-CM | POA: Diagnosis not present

## 2011-09-16 DIAGNOSIS — IMO0002 Reserved for concepts with insufficient information to code with codable children: Secondary | ICD-10-CM | POA: Diagnosis not present

## 2011-12-21 DIAGNOSIS — L259 Unspecified contact dermatitis, unspecified cause: Secondary | ICD-10-CM | POA: Diagnosis not present

## 2012-02-17 DIAGNOSIS — E119 Type 2 diabetes mellitus without complications: Secondary | ICD-10-CM | POA: Diagnosis not present

## 2012-02-17 DIAGNOSIS — K219 Gastro-esophageal reflux disease without esophagitis: Secondary | ICD-10-CM | POA: Diagnosis not present

## 2012-02-17 DIAGNOSIS — I10 Essential (primary) hypertension: Secondary | ICD-10-CM | POA: Diagnosis not present

## 2012-02-22 ENCOUNTER — Other Ambulatory Visit: Payer: Self-pay | Admitting: Internal Medicine

## 2012-02-22 DIAGNOSIS — Z1231 Encounter for screening mammogram for malignant neoplasm of breast: Secondary | ICD-10-CM

## 2012-02-29 ENCOUNTER — Ambulatory Visit
Admission: RE | Admit: 2012-02-29 | Discharge: 2012-02-29 | Disposition: A | Discharge status: Home / Self Care | Payer: 59 | Source: Ambulatory Visit | Attending: Internal Medicine | Admitting: Internal Medicine

## 2012-02-29 DIAGNOSIS — Z1231 Encounter for screening mammogram for malignant neoplasm of breast: Secondary | ICD-10-CM

## 2012-05-25 DIAGNOSIS — I10 Essential (primary) hypertension: Secondary | ICD-10-CM | POA: Diagnosis not present

## 2012-05-25 DIAGNOSIS — Z23 Encounter for immunization: Secondary | ICD-10-CM | POA: Diagnosis not present

## 2012-05-25 DIAGNOSIS — F325 Major depressive disorder, single episode, in full remission: Secondary | ICD-10-CM | POA: Diagnosis not present

## 2012-05-25 DIAGNOSIS — E119 Type 2 diabetes mellitus without complications: Secondary | ICD-10-CM | POA: Diagnosis not present

## 2012-10-24 DIAGNOSIS — E039 Hypothyroidism, unspecified: Secondary | ICD-10-CM | POA: Diagnosis not present

## 2012-10-24 DIAGNOSIS — Z0000 Encounter for general adult medical examination without abnormal findings: Secondary | ICD-10-CM | POA: Diagnosis not present

## 2012-10-24 DIAGNOSIS — I10 Essential (primary) hypertension: Secondary | ICD-10-CM | POA: Diagnosis not present

## 2012-10-24 DIAGNOSIS — K219 Gastro-esophageal reflux disease without esophagitis: Secondary | ICD-10-CM | POA: Diagnosis not present

## 2012-10-24 DIAGNOSIS — E785 Hyperlipidemia, unspecified: Secondary | ICD-10-CM | POA: Diagnosis not present

## 2012-10-24 DIAGNOSIS — Z1331 Encounter for screening for depression: Secondary | ICD-10-CM | POA: Diagnosis not present

## 2012-10-24 DIAGNOSIS — R404 Transient alteration of awareness: Secondary | ICD-10-CM | POA: Diagnosis not present

## 2012-10-24 DIAGNOSIS — E119 Type 2 diabetes mellitus without complications: Secondary | ICD-10-CM | POA: Diagnosis not present

## 2013-01-24 DIAGNOSIS — G4733 Obstructive sleep apnea (adult) (pediatric): Secondary | ICD-10-CM | POA: Diagnosis not present

## 2013-04-05 DIAGNOSIS — E119 Type 2 diabetes mellitus without complications: Secondary | ICD-10-CM | POA: Diagnosis not present

## 2013-04-05 DIAGNOSIS — I10 Essential (primary) hypertension: Secondary | ICD-10-CM | POA: Diagnosis not present

## 2013-04-05 DIAGNOSIS — K219 Gastro-esophageal reflux disease without esophagitis: Secondary | ICD-10-CM | POA: Diagnosis not present

## 2013-04-13 DIAGNOSIS — N390 Urinary tract infection, site not specified: Secondary | ICD-10-CM | POA: Diagnosis not present

## 2013-04-13 DIAGNOSIS — R5381 Other malaise: Secondary | ICD-10-CM | POA: Diagnosis not present

## 2013-05-07 DIAGNOSIS — Z23 Encounter for immunization: Secondary | ICD-10-CM | POA: Diagnosis not present

## 2013-05-27 ENCOUNTER — Other Ambulatory Visit: Payer: Self-pay

## 2013-05-27 DIAGNOSIS — Z1231 Encounter for screening mammogram for malignant neoplasm of breast: Secondary | ICD-10-CM

## 2013-05-30 DIAGNOSIS — R300 Dysuria: Secondary | ICD-10-CM | POA: Diagnosis not present

## 2013-06-27 ENCOUNTER — Ambulatory Visit
Admission: RE | Admit: 2013-06-27 | Discharge: 2013-06-27 | Disposition: A | Discharge status: Home / Self Care | Payer: Medicare Other | Source: Ambulatory Visit

## 2013-06-27 DIAGNOSIS — Z1231 Encounter for screening mammogram for malignant neoplasm of breast: Secondary | ICD-10-CM

## 2013-07-05 DIAGNOSIS — I10 Essential (primary) hypertension: Secondary | ICD-10-CM | POA: Diagnosis not present

## 2013-07-05 DIAGNOSIS — K219 Gastro-esophageal reflux disease without esophagitis: Secondary | ICD-10-CM | POA: Diagnosis not present

## 2013-10-29 DIAGNOSIS — Z0000 Encounter for general adult medical examination without abnormal findings: Secondary | ICD-10-CM | POA: Diagnosis not present

## 2013-10-29 DIAGNOSIS — E785 Hyperlipidemia, unspecified: Secondary | ICD-10-CM | POA: Diagnosis not present

## 2013-10-29 DIAGNOSIS — Z1331 Encounter for screening for depression: Secondary | ICD-10-CM | POA: Diagnosis not present

## 2013-10-29 DIAGNOSIS — E039 Hypothyroidism, unspecified: Secondary | ICD-10-CM | POA: Diagnosis not present

## 2013-10-29 DIAGNOSIS — IMO0001 Reserved for inherently not codable concepts without codable children: Secondary | ICD-10-CM | POA: Diagnosis not present

## 2013-10-29 DIAGNOSIS — I10 Essential (primary) hypertension: Secondary | ICD-10-CM | POA: Diagnosis not present

## 2013-10-29 DIAGNOSIS — K219 Gastro-esophageal reflux disease without esophagitis: Secondary | ICD-10-CM | POA: Diagnosis not present

## 2013-10-29 DIAGNOSIS — Z23 Encounter for immunization: Secondary | ICD-10-CM | POA: Diagnosis not present

## 2013-12-27 DIAGNOSIS — IMO0001 Reserved for inherently not codable concepts without codable children: Secondary | ICD-10-CM | POA: Diagnosis not present

## 2013-12-27 DIAGNOSIS — I10 Essential (primary) hypertension: Secondary | ICD-10-CM | POA: Diagnosis not present

## 2013-12-27 DIAGNOSIS — K219 Gastro-esophageal reflux disease without esophagitis: Secondary | ICD-10-CM | POA: Diagnosis not present

## 2014-03-28 DIAGNOSIS — IMO0001 Reserved for inherently not codable concepts without codable children: Secondary | ICD-10-CM | POA: Diagnosis not present

## 2014-04-17 DIAGNOSIS — H2510 Age-related nuclear cataract, unspecified eye: Secondary | ICD-10-CM | POA: Diagnosis not present

## 2014-04-17 DIAGNOSIS — E119 Type 2 diabetes mellitus without complications: Secondary | ICD-10-CM | POA: Diagnosis not present

## 2014-05-13 DIAGNOSIS — Z23 Encounter for immunization: Secondary | ICD-10-CM | POA: Diagnosis not present

## 2014-09-12 DIAGNOSIS — I10 Essential (primary) hypertension: Secondary | ICD-10-CM | POA: Diagnosis not present

## 2014-09-12 DIAGNOSIS — K219 Gastro-esophageal reflux disease without esophagitis: Secondary | ICD-10-CM | POA: Diagnosis not present

## 2014-09-12 DIAGNOSIS — E119 Type 2 diabetes mellitus without complications: Secondary | ICD-10-CM | POA: Diagnosis not present

## 2014-09-12 DIAGNOSIS — G4733 Obstructive sleep apnea (adult) (pediatric): Secondary | ICD-10-CM | POA: Diagnosis not present

## 2014-10-23 ENCOUNTER — Other Ambulatory Visit: Payer: Self-pay

## 2014-10-23 DIAGNOSIS — Z1231 Encounter for screening mammogram for malignant neoplasm of breast: Secondary | ICD-10-CM

## 2014-10-31 ENCOUNTER — Ambulatory Visit
Admission: RE | Admit: 2014-10-31 | Discharge: 2014-10-31 | Disposition: A | Discharge status: Home / Self Care | Payer: Medicare Other | Source: Ambulatory Visit

## 2014-10-31 DIAGNOSIS — Z1231 Encounter for screening mammogram for malignant neoplasm of breast: Secondary | ICD-10-CM

## 2015-02-18 DIAGNOSIS — N390 Urinary tract infection, site not specified: Secondary | ICD-10-CM | POA: Diagnosis not present

## 2015-02-18 DIAGNOSIS — R110 Nausea: Secondary | ICD-10-CM | POA: Diagnosis not present

## 2015-02-18 DIAGNOSIS — M545 Low back pain: Secondary | ICD-10-CM | POA: Diagnosis not present

## 2015-03-06 DIAGNOSIS — Z6838 Body mass index (BMI) 38.0-38.9, adult: Secondary | ICD-10-CM | POA: Diagnosis not present

## 2015-03-06 DIAGNOSIS — E119 Type 2 diabetes mellitus without complications: Secondary | ICD-10-CM | POA: Diagnosis not present

## 2015-03-06 DIAGNOSIS — I10 Essential (primary) hypertension: Secondary | ICD-10-CM | POA: Diagnosis not present

## 2015-03-06 DIAGNOSIS — E039 Hypothyroidism, unspecified: Secondary | ICD-10-CM | POA: Diagnosis not present

## 2015-03-06 DIAGNOSIS — Z6839 Body mass index (BMI) 39.0-39.9, adult: Secondary | ICD-10-CM | POA: Diagnosis not present

## 2015-03-06 DIAGNOSIS — Z794 Long term (current) use of insulin: Secondary | ICD-10-CM | POA: Diagnosis not present

## 2015-03-06 DIAGNOSIS — Z1389 Encounter for screening for other disorder: Secondary | ICD-10-CM | POA: Diagnosis not present

## 2015-03-06 DIAGNOSIS — E782 Mixed hyperlipidemia: Secondary | ICD-10-CM | POA: Diagnosis not present

## 2015-03-06 DIAGNOSIS — G4733 Obstructive sleep apnea (adult) (pediatric): Secondary | ICD-10-CM | POA: Diagnosis not present

## 2015-04-22 DIAGNOSIS — Z23 Encounter for immunization: Secondary | ICD-10-CM | POA: Diagnosis not present

## 2015-04-23 ENCOUNTER — Other Ambulatory Visit: Payer: Self-pay | Admitting: Gastroenterology

## 2015-07-02 ENCOUNTER — Encounter (HOSPITAL_COMMUNITY): Payer: Self-pay | Admitting: *Deleted

## 2015-07-07 ENCOUNTER — Encounter (HOSPITAL_COMMUNITY)
Admission: RE | Disposition: A | Discharge status: Home / Self Care | Payer: Self-pay | Source: Ambulatory Visit | Attending: Gastroenterology

## 2015-07-07 ENCOUNTER — Encounter (HOSPITAL_COMMUNITY): Payer: Self-pay

## 2015-07-07 ENCOUNTER — Ambulatory Visit (HOSPITAL_COMMUNITY): Payer: Medicare Other | Admitting: Certified Registered Nurse Anesthetist

## 2015-07-07 ENCOUNTER — Ambulatory Visit (HOSPITAL_COMMUNITY)
Admission: RE | Admit: 2015-07-07 | Discharge: 2015-07-07 | Disposition: A | Discharge status: Home / Self Care | Payer: Medicare Other | Source: Ambulatory Visit | Attending: Gastroenterology | Admitting: Gastroenterology

## 2015-07-07 DIAGNOSIS — Z6841 Body Mass Index (BMI) 40.0 and over, adult: Secondary | ICD-10-CM | POA: Diagnosis not present

## 2015-07-07 DIAGNOSIS — E039 Hypothyroidism, unspecified: Secondary | ICD-10-CM | POA: Insufficient documentation

## 2015-07-07 DIAGNOSIS — E7800 Pure hypercholesterolemia, unspecified: Secondary | ICD-10-CM | POA: Insufficient documentation

## 2015-07-07 DIAGNOSIS — Z7984 Long term (current) use of oral hypoglycemic drugs: Secondary | ICD-10-CM | POA: Insufficient documentation

## 2015-07-07 DIAGNOSIS — E119 Type 2 diabetes mellitus without complications: Secondary | ICD-10-CM | POA: Diagnosis not present

## 2015-07-07 DIAGNOSIS — Z1211 Encounter for screening for malignant neoplasm of colon: Secondary | ICD-10-CM | POA: Insufficient documentation

## 2015-07-07 DIAGNOSIS — Z538 Procedure and treatment not carried out for other reasons: Secondary | ICD-10-CM | POA: Diagnosis not present

## 2015-07-07 DIAGNOSIS — Z79899 Other long term (current) drug therapy: Secondary | ICD-10-CM | POA: Diagnosis not present

## 2015-07-07 DIAGNOSIS — G4733 Obstructive sleep apnea (adult) (pediatric): Secondary | ICD-10-CM | POA: Diagnosis not present

## 2015-07-07 DIAGNOSIS — I10 Essential (primary) hypertension: Secondary | ICD-10-CM | POA: Diagnosis not present

## 2015-07-07 SURGERY — CANCELLED PROCEDURE
Anesthesia: General

## 2015-07-07 MED ORDER — SODIUM CHLORIDE 0.9 % IV SOLN: INTRAVENOUS | Status: DC

## 2015-07-07 MED ORDER — LACTATED RINGERS IV SOLN: INTRAVENOUS | Status: DC

## 2015-07-07 MED ORDER — PROPOFOL 10 MG/ML IV BOLUS
INTRAVENOUS | Status: AC
  Filled 2015-07-07: qty 40

## 2015-07-07 SURGICAL SUPPLY — 22 items

## 2015-07-07 NOTE — Anesthesia Preprocedure Evaluation (Addendum)
Anesthesia Evaluation  Patient identified by MRN, date of birth, ID band Patient awake    Reviewed: Allergy & Precautions, H&P , NPO status , Patient's Chart, lab work & pertinent test results, reviewed documented beta blocker date and time   History of Anesthesia Complications (+) PONV  Airway Mallampati: II  TM Distance: >3 FB Neck ROM: full    Dental  (+) Caps, Dental Advisory Given Caps lower front:   Pulmonary neg pulmonary ROS,    Pulmonary exam normal breath sounds clear to auscultation       Cardiovascular Exercise Tolerance: Good hypertension, Pt. on medications and Pt. on home beta blockers  Rhythm:regular Rate:Normal     Neuro/Psych negative neurological ROS  negative psych ROS   GI/Hepatic negative GI ROS, Neg liver ROS,   Endo/Other  diabetes, Well Controlled, Type 2, Insulin DependentHypothyroidism Morbid obesity  Renal/GU negative Renal ROS  negative genitourinary   Musculoskeletal   Abdominal (+) + obese,   Peds  Hematology negative hematology ROS (+)   Anesthesia Other Findings   Reproductive/Obstetrics negative OB ROS                            Anesthesia Physical Anesthesia Plan  ASA: II  Anesthesia Plan: General   Post-op Pain Management:    Induction:   Airway Management Planned:   Additional Equipment:   Intra-op Plan:   Post-operative Plan:   Informed Consent: I have reviewed the patients History and Physical, chart, labs and discussed the procedure including the risks, benefits and alternatives for the proposed anesthesia with the patient or authorized representative who has indicated his/her understanding and acceptance.   Dental Advisory Given  Plan Discussed with: CRNA  Anesthesia Plan Comments:         Anesthesia Quick Evaluation

## 2015-07-07 NOTE — Progress Notes (Signed)
Pt. Difficult IV stick.  After several attempts, including with ultrasound, pt decided that she didn't want to be stuck anymore.  Dr. Wynetta Emery made aware and spoke with pt. Prior to her leaving.

## 2015-07-07 NOTE — H&P (Signed)
  Procedure: Screening colonoscopy. Normal screening colonoscopy performed on 09/21/2004. Obstructive sleep apnea syndrome.  History: The patient is a 69 year old female born 10-07-1945. She is scheduled to undergo a repeat screening colonoscopy today.  Past medical history: Type 2 diabetes mellitus. Hypertension. Hypercholesterolemia. Hypothyroidism. Depression. Obstructive sleep apnea syndrome. Cholecystectomy. Lumbar discectomy.  Medication allergies: Altase caused cough. Lipitor causes myalgias.  Exam: The patient is alert and lying comfortably on the endoscopy stretcher. Abdomen soft and nontender to palpation. Lungs are clear to auscultation. Cardiac exam reveals a regular rhythm.  Plan: Proceed with screening colonoscopy

## 2015-07-07 NOTE — Op Note (Signed)
The patient's screening colonoscopy was canceled. IV access for the administration of conscious sedation could not be established.  Recommendation: Cologuard stool DNA colon cancer screening or virtual colonoscopy

## 2015-07-08 LAB — GLUCOSE, CAPILLARY: GLUCOSE-CAPILLARY: 81 mg/dL (ref 65–99)

## 2015-07-31 DIAGNOSIS — G4733 Obstructive sleep apnea (adult) (pediatric): Secondary | ICD-10-CM | POA: Diagnosis not present

## 2015-07-31 DIAGNOSIS — E119 Type 2 diabetes mellitus without complications: Secondary | ICD-10-CM | POA: Diagnosis not present

## 2015-07-31 DIAGNOSIS — Z7984 Long term (current) use of oral hypoglycemic drugs: Secondary | ICD-10-CM | POA: Diagnosis not present

## 2015-07-31 DIAGNOSIS — E782 Mixed hyperlipidemia: Secondary | ICD-10-CM | POA: Diagnosis not present

## 2015-07-31 DIAGNOSIS — K219 Gastro-esophageal reflux disease without esophagitis: Secondary | ICD-10-CM | POA: Diagnosis not present

## 2015-07-31 DIAGNOSIS — I10 Essential (primary) hypertension: Secondary | ICD-10-CM | POA: Diagnosis not present

## 2015-07-31 DIAGNOSIS — R350 Frequency of micturition: Secondary | ICD-10-CM | POA: Diagnosis not present

## 2015-11-05 DIAGNOSIS — D2239 Melanocytic nevi of other parts of face: Secondary | ICD-10-CM | POA: Diagnosis not present

## 2015-11-05 DIAGNOSIS — L820 Inflamed seborrheic keratosis: Secondary | ICD-10-CM | POA: Diagnosis not present

## 2015-11-20 DIAGNOSIS — E119 Type 2 diabetes mellitus without complications: Secondary | ICD-10-CM | POA: Diagnosis not present

## 2015-11-20 DIAGNOSIS — H2510 Age-related nuclear cataract, unspecified eye: Secondary | ICD-10-CM | POA: Diagnosis not present

## 2015-11-20 DIAGNOSIS — Z7984 Long term (current) use of oral hypoglycemic drugs: Secondary | ICD-10-CM | POA: Diagnosis not present

## 2015-11-20 DIAGNOSIS — Z794 Long term (current) use of insulin: Secondary | ICD-10-CM | POA: Diagnosis not present

## 2015-12-07 DIAGNOSIS — H02403 Unspecified ptosis of bilateral eyelids: Secondary | ICD-10-CM | POA: Diagnosis not present

## 2016-02-05 DIAGNOSIS — Z0000 Encounter for general adult medical examination without abnormal findings: Secondary | ICD-10-CM | POA: Diagnosis not present

## 2016-02-05 DIAGNOSIS — Z6841 Body Mass Index (BMI) 40.0 and over, adult: Secondary | ICD-10-CM | POA: Diagnosis not present

## 2016-02-05 DIAGNOSIS — E782 Mixed hyperlipidemia: Secondary | ICD-10-CM | POA: Diagnosis not present

## 2016-02-05 DIAGNOSIS — E039 Hypothyroidism, unspecified: Secondary | ICD-10-CM | POA: Diagnosis not present

## 2016-02-05 DIAGNOSIS — E119 Type 2 diabetes mellitus without complications: Secondary | ICD-10-CM | POA: Diagnosis not present

## 2016-02-05 DIAGNOSIS — Z7984 Long term (current) use of oral hypoglycemic drugs: Secondary | ICD-10-CM | POA: Diagnosis not present

## 2016-02-05 DIAGNOSIS — I10 Essential (primary) hypertension: Secondary | ICD-10-CM | POA: Diagnosis not present

## 2016-02-05 DIAGNOSIS — Z1389 Encounter for screening for other disorder: Secondary | ICD-10-CM | POA: Diagnosis not present

## 2016-02-05 DIAGNOSIS — F325 Major depressive disorder, single episode, in full remission: Secondary | ICD-10-CM | POA: Diagnosis not present

## 2016-02-05 DIAGNOSIS — G4733 Obstructive sleep apnea (adult) (pediatric): Secondary | ICD-10-CM | POA: Diagnosis not present

## 2016-02-08 DIAGNOSIS — H02831 Dermatochalasis of right upper eyelid: Secondary | ICD-10-CM | POA: Diagnosis not present

## 2016-02-08 DIAGNOSIS — H02402 Unspecified ptosis of left eyelid: Secondary | ICD-10-CM | POA: Diagnosis not present

## 2016-02-08 DIAGNOSIS — H02401 Unspecified ptosis of right eyelid: Secondary | ICD-10-CM | POA: Diagnosis not present

## 2016-02-08 DIAGNOSIS — H02834 Dermatochalasis of left upper eyelid: Secondary | ICD-10-CM | POA: Diagnosis not present

## 2016-02-08 DIAGNOSIS — H02403 Unspecified ptosis of bilateral eyelids: Secondary | ICD-10-CM | POA: Diagnosis not present

## 2016-03-30 ENCOUNTER — Other Ambulatory Visit: Payer: Self-pay | Admitting: Internal Medicine

## 2016-03-30 DIAGNOSIS — Z1231 Encounter for screening mammogram for malignant neoplasm of breast: Secondary | ICD-10-CM

## 2016-04-08 ENCOUNTER — Ambulatory Visit
Admission: RE | Admit: 2016-04-08 | Discharge: 2016-04-08 | Disposition: A | Discharge status: Home / Self Care | Payer: Medicare Other | Source: Ambulatory Visit | Attending: Internal Medicine | Admitting: Internal Medicine

## 2016-04-08 DIAGNOSIS — Z1231 Encounter for screening mammogram for malignant neoplasm of breast: Secondary | ICD-10-CM

## 2016-04-21 DIAGNOSIS — N390 Urinary tract infection, site not specified: Secondary | ICD-10-CM | POA: Diagnosis not present

## 2016-04-21 DIAGNOSIS — R319 Hematuria, unspecified: Secondary | ICD-10-CM | POA: Diagnosis not present

## 2016-04-29 DIAGNOSIS — Z23 Encounter for immunization: Secondary | ICD-10-CM | POA: Diagnosis not present

## 2016-04-29 DIAGNOSIS — R3129 Other microscopic hematuria: Secondary | ICD-10-CM | POA: Diagnosis not present

## 2016-04-29 DIAGNOSIS — N10 Acute tubulo-interstitial nephritis: Secondary | ICD-10-CM | POA: Diagnosis not present

## 2016-09-16 DIAGNOSIS — Z7984 Long term (current) use of oral hypoglycemic drugs: Secondary | ICD-10-CM | POA: Diagnosis not present

## 2016-09-16 DIAGNOSIS — F325 Major depressive disorder, single episode, in full remission: Secondary | ICD-10-CM | POA: Diagnosis not present

## 2016-09-16 DIAGNOSIS — E119 Type 2 diabetes mellitus without complications: Secondary | ICD-10-CM | POA: Diagnosis not present

## 2016-09-16 DIAGNOSIS — Z6841 Body Mass Index (BMI) 40.0 and over, adult: Secondary | ICD-10-CM | POA: Diagnosis not present

## 2016-09-16 DIAGNOSIS — E782 Mixed hyperlipidemia: Secondary | ICD-10-CM | POA: Diagnosis not present

## 2016-09-16 DIAGNOSIS — I10 Essential (primary) hypertension: Secondary | ICD-10-CM | POA: Diagnosis not present

## 2017-02-09 DIAGNOSIS — I10 Essential (primary) hypertension: Secondary | ICD-10-CM | POA: Diagnosis not present

## 2017-02-09 DIAGNOSIS — E1165 Type 2 diabetes mellitus with hyperglycemia: Secondary | ICD-10-CM | POA: Diagnosis not present

## 2017-02-09 DIAGNOSIS — Z7984 Long term (current) use of oral hypoglycemic drugs: Secondary | ICD-10-CM | POA: Diagnosis not present

## 2017-02-09 DIAGNOSIS — Z6841 Body Mass Index (BMI) 40.0 and over, adult: Secondary | ICD-10-CM | POA: Diagnosis not present

## 2017-02-09 DIAGNOSIS — Z794 Long term (current) use of insulin: Secondary | ICD-10-CM | POA: Diagnosis not present

## 2017-02-09 DIAGNOSIS — E039 Hypothyroidism, unspecified: Secondary | ICD-10-CM | POA: Diagnosis not present

## 2017-02-09 DIAGNOSIS — F325 Major depressive disorder, single episode, in full remission: Secondary | ICD-10-CM | POA: Diagnosis not present

## 2017-02-09 DIAGNOSIS — G4733 Obstructive sleep apnea (adult) (pediatric): Secondary | ICD-10-CM | POA: Diagnosis not present

## 2017-02-09 DIAGNOSIS — Z0000 Encounter for general adult medical examination without abnormal findings: Secondary | ICD-10-CM | POA: Diagnosis not present

## 2017-02-09 DIAGNOSIS — Z1211 Encounter for screening for malignant neoplasm of colon: Secondary | ICD-10-CM | POA: Diagnosis not present

## 2017-02-09 DIAGNOSIS — E782 Mixed hyperlipidemia: Secondary | ICD-10-CM | POA: Diagnosis not present

## 2017-02-09 DIAGNOSIS — E119 Type 2 diabetes mellitus without complications: Secondary | ICD-10-CM | POA: Diagnosis not present

## 2017-02-09 DIAGNOSIS — R1013 Epigastric pain: Secondary | ICD-10-CM | POA: Diagnosis not present

## 2017-03-29 DIAGNOSIS — Z1211 Encounter for screening for malignant neoplasm of colon: Secondary | ICD-10-CM | POA: Diagnosis not present

## 2017-03-29 DIAGNOSIS — K640 First degree hemorrhoids: Secondary | ICD-10-CM | POA: Diagnosis not present

## 2017-05-12 ENCOUNTER — Other Ambulatory Visit: Payer: Self-pay | Admitting: Internal Medicine

## 2017-05-12 DIAGNOSIS — Z1231 Encounter for screening mammogram for malignant neoplasm of breast: Secondary | ICD-10-CM

## 2017-05-18 DIAGNOSIS — Z23 Encounter for immunization: Secondary | ICD-10-CM | POA: Diagnosis not present

## 2017-06-01 ENCOUNTER — Ambulatory Visit: Payer: Medicare Other

## 2017-06-22 ENCOUNTER — Other Ambulatory Visit: Payer: Self-pay | Admitting: Internal Medicine

## 2017-06-22 DIAGNOSIS — R1013 Epigastric pain: Secondary | ICD-10-CM | POA: Diagnosis not present

## 2017-06-22 DIAGNOSIS — I10 Essential (primary) hypertension: Secondary | ICD-10-CM | POA: Diagnosis not present

## 2017-06-22 DIAGNOSIS — R351 Nocturia: Secondary | ICD-10-CM | POA: Diagnosis not present

## 2017-06-22 DIAGNOSIS — E119 Type 2 diabetes mellitus without complications: Secondary | ICD-10-CM | POA: Diagnosis not present

## 2017-06-29 ENCOUNTER — Ambulatory Visit
Admission: RE | Admit: 2017-06-29 | Discharge: 2017-06-29 | Disposition: A | Discharge status: Home / Self Care | Payer: Medicare Other | Source: Ambulatory Visit | Attending: Internal Medicine | Admitting: Internal Medicine

## 2017-06-29 DIAGNOSIS — Z1231 Encounter for screening mammogram for malignant neoplasm of breast: Secondary | ICD-10-CM | POA: Diagnosis not present

## 2017-06-30 ENCOUNTER — Ambulatory Visit
Admission: RE | Admit: 2017-06-30 | Discharge: 2017-06-30 | Disposition: A | Discharge status: Home / Self Care | Payer: Medicare Other | Source: Ambulatory Visit | Attending: Internal Medicine | Admitting: Internal Medicine

## 2017-06-30 DIAGNOSIS — R1013 Epigastric pain: Secondary | ICD-10-CM

## 2017-06-30 DIAGNOSIS — K449 Diaphragmatic hernia without obstruction or gangrene: Secondary | ICD-10-CM | POA: Diagnosis not present

## 2017-06-30 MED ORDER — IOPAMIDOL (ISOVUE-300) INJECTION 61%
125.0000 mL | Freq: Once | INTRAVENOUS | Status: AC | PRN
  Administered 2017-06-30: 125 mL via INTRAVENOUS

## 2017-08-03 DIAGNOSIS — Z7984 Long term (current) use of oral hypoglycemic drugs: Secondary | ICD-10-CM | POA: Diagnosis not present

## 2017-08-03 DIAGNOSIS — E119 Type 2 diabetes mellitus without complications: Secondary | ICD-10-CM | POA: Diagnosis not present

## 2017-08-03 DIAGNOSIS — H2510 Age-related nuclear cataract, unspecified eye: Secondary | ICD-10-CM | POA: Diagnosis not present

## 2017-10-06 DIAGNOSIS — E1165 Type 2 diabetes mellitus with hyperglycemia: Secondary | ICD-10-CM | POA: Diagnosis not present

## 2017-10-06 DIAGNOSIS — Z7984 Long term (current) use of oral hypoglycemic drugs: Secondary | ICD-10-CM | POA: Diagnosis not present

## 2017-10-06 DIAGNOSIS — Z6841 Body Mass Index (BMI) 40.0 and over, adult: Secondary | ICD-10-CM | POA: Diagnosis not present

## 2017-10-06 DIAGNOSIS — I10 Essential (primary) hypertension: Secondary | ICD-10-CM | POA: Diagnosis not present

## 2017-10-06 DIAGNOSIS — E119 Type 2 diabetes mellitus without complications: Secondary | ICD-10-CM | POA: Diagnosis not present

## 2017-10-06 DIAGNOSIS — F325 Major depressive disorder, single episode, in full remission: Secondary | ICD-10-CM | POA: Diagnosis not present

## 2017-10-06 DIAGNOSIS — G4733 Obstructive sleep apnea (adult) (pediatric): Secondary | ICD-10-CM | POA: Diagnosis not present

## 2017-11-24 DIAGNOSIS — N390 Urinary tract infection, site not specified: Secondary | ICD-10-CM | POA: Diagnosis not present

## 2018-02-26 DIAGNOSIS — E119 Type 2 diabetes mellitus without complications: Secondary | ICD-10-CM | POA: Diagnosis not present

## 2018-02-26 DIAGNOSIS — Z7984 Long term (current) use of oral hypoglycemic drugs: Secondary | ICD-10-CM | POA: Diagnosis not present

## 2018-02-26 DIAGNOSIS — N390 Urinary tract infection, site not specified: Secondary | ICD-10-CM | POA: Diagnosis not present

## 2018-03-02 DIAGNOSIS — E782 Mixed hyperlipidemia: Secondary | ICD-10-CM | POA: Diagnosis not present

## 2018-03-02 DIAGNOSIS — F325 Major depressive disorder, single episode, in full remission: Secondary | ICD-10-CM | POA: Diagnosis not present

## 2018-03-02 DIAGNOSIS — Z0000 Encounter for general adult medical examination without abnormal findings: Secondary | ICD-10-CM | POA: Diagnosis not present

## 2018-03-02 DIAGNOSIS — E1165 Type 2 diabetes mellitus with hyperglycemia: Secondary | ICD-10-CM | POA: Diagnosis not present

## 2018-03-02 DIAGNOSIS — Z794 Long term (current) use of insulin: Secondary | ICD-10-CM | POA: Diagnosis not present

## 2018-03-02 DIAGNOSIS — Z1389 Encounter for screening for other disorder: Secondary | ICD-10-CM | POA: Diagnosis not present

## 2018-03-02 DIAGNOSIS — I10 Essential (primary) hypertension: Secondary | ICD-10-CM | POA: Diagnosis not present

## 2018-03-02 DIAGNOSIS — E1169 Type 2 diabetes mellitus with other specified complication: Secondary | ICD-10-CM | POA: Diagnosis not present

## 2018-03-02 DIAGNOSIS — Z6841 Body Mass Index (BMI) 40.0 and over, adult: Secondary | ICD-10-CM | POA: Diagnosis not present

## 2018-03-02 DIAGNOSIS — G4733 Obstructive sleep apnea (adult) (pediatric): Secondary | ICD-10-CM | POA: Diagnosis not present

## 2018-03-02 DIAGNOSIS — E039 Hypothyroidism, unspecified: Secondary | ICD-10-CM | POA: Diagnosis not present

## 2018-05-14 DIAGNOSIS — Z23 Encounter for immunization: Secondary | ICD-10-CM | POA: Diagnosis not present

## 2018-06-08 DIAGNOSIS — N390 Urinary tract infection, site not specified: Secondary | ICD-10-CM | POA: Diagnosis not present

## 2018-06-08 DIAGNOSIS — R399 Unspecified symptoms and signs involving the genitourinary system: Secondary | ICD-10-CM | POA: Diagnosis not present

## 2018-07-04 DIAGNOSIS — R102 Pelvic and perineal pain: Secondary | ICD-10-CM | POA: Diagnosis not present

## 2018-07-04 DIAGNOSIS — Z6841 Body Mass Index (BMI) 40.0 and over, adult: Secondary | ICD-10-CM | POA: Diagnosis not present

## 2018-07-05 DIAGNOSIS — E1169 Type 2 diabetes mellitus with other specified complication: Secondary | ICD-10-CM | POA: Diagnosis not present

## 2018-07-05 DIAGNOSIS — D649 Anemia, unspecified: Secondary | ICD-10-CM | POA: Diagnosis not present

## 2018-07-05 DIAGNOSIS — I10 Essential (primary) hypertension: Secondary | ICD-10-CM | POA: Diagnosis not present

## 2018-07-05 DIAGNOSIS — R3989 Other symptoms and signs involving the genitourinary system: Secondary | ICD-10-CM | POA: Diagnosis not present

## 2018-07-05 DIAGNOSIS — R1013 Epigastric pain: Secondary | ICD-10-CM | POA: Diagnosis not present

## 2018-07-09 DIAGNOSIS — E538 Deficiency of other specified B group vitamins: Secondary | ICD-10-CM | POA: Diagnosis not present

## 2018-07-09 DIAGNOSIS — Z79899 Other long term (current) drug therapy: Secondary | ICD-10-CM | POA: Diagnosis not present

## 2018-07-09 DIAGNOSIS — D649 Anemia, unspecified: Secondary | ICD-10-CM | POA: Diagnosis not present

## 2018-07-09 DIAGNOSIS — N390 Urinary tract infection, site not specified: Secondary | ICD-10-CM | POA: Diagnosis not present

## 2018-08-06 ENCOUNTER — Other Ambulatory Visit: Payer: Self-pay | Admitting: Internal Medicine

## 2018-08-06 DIAGNOSIS — Z1231 Encounter for screening mammogram for malignant neoplasm of breast: Secondary | ICD-10-CM

## 2018-08-17 ENCOUNTER — Other Ambulatory Visit (HOSPITAL_COMMUNITY): Payer: Self-pay | Admitting: Gastroenterology

## 2018-08-17 DIAGNOSIS — R1013 Epigastric pain: Secondary | ICD-10-CM | POA: Diagnosis not present

## 2018-08-17 DIAGNOSIS — R140 Abdominal distension (gaseous): Secondary | ICD-10-CM | POA: Diagnosis not present

## 2018-08-30 ENCOUNTER — Ambulatory Visit (HOSPITAL_COMMUNITY)
Admission: RE | Admit: 2018-08-30 | Discharge: 2018-08-30 | Disposition: A | Discharge status: Home / Self Care | Payer: Medicare Other | Source: Ambulatory Visit | Attending: Gastroenterology | Admitting: Gastroenterology

## 2018-08-30 DIAGNOSIS — R1013 Epigastric pain: Secondary | ICD-10-CM | POA: Insufficient documentation

## 2018-08-30 DIAGNOSIS — R140 Abdominal distension (gaseous): Secondary | ICD-10-CM | POA: Diagnosis not present

## 2018-08-30 DIAGNOSIS — R6881 Early satiety: Secondary | ICD-10-CM | POA: Diagnosis not present

## 2018-08-30 MED ORDER — TECHNETIUM TC 99M SULFUR COLLOID
2.0000 | Freq: Once | INTRAVENOUS | Status: AC | PRN
  Administered 2018-08-30: 2 via ORAL

## 2018-09-06 ENCOUNTER — Ambulatory Visit
Admission: RE | Admit: 2018-09-06 | Discharge: 2018-09-06 | Disposition: A | Discharge status: Home / Self Care | Payer: Medicare Other | Source: Ambulatory Visit | Attending: Internal Medicine | Admitting: Internal Medicine

## 2018-09-06 DIAGNOSIS — Z1231 Encounter for screening mammogram for malignant neoplasm of breast: Secondary | ICD-10-CM | POA: Diagnosis not present

## 2018-10-02 DIAGNOSIS — K259 Gastric ulcer, unspecified as acute or chronic, without hemorrhage or perforation: Secondary | ICD-10-CM | POA: Diagnosis not present

## 2018-10-02 DIAGNOSIS — R140 Abdominal distension (gaseous): Secondary | ICD-10-CM | POA: Diagnosis not present

## 2018-10-02 DIAGNOSIS — R1013 Epigastric pain: Secondary | ICD-10-CM | POA: Diagnosis not present

## 2018-10-02 DIAGNOSIS — K2980 Duodenitis without bleeding: Secondary | ICD-10-CM | POA: Diagnosis not present

## 2018-10-02 DIAGNOSIS — K2930 Chronic superficial gastritis without bleeding: Secondary | ICD-10-CM | POA: Diagnosis not present

## 2018-10-08 DIAGNOSIS — K2980 Duodenitis without bleeding: Secondary | ICD-10-CM | POA: Diagnosis not present

## 2018-10-08 DIAGNOSIS — K2930 Chronic superficial gastritis without bleeding: Secondary | ICD-10-CM | POA: Diagnosis not present

## 2018-11-09 DIAGNOSIS — Z794 Long term (current) use of insulin: Secondary | ICD-10-CM | POA: Diagnosis not present

## 2018-11-09 DIAGNOSIS — K257 Chronic gastric ulcer without hemorrhage or perforation: Secondary | ICD-10-CM | POA: Diagnosis not present

## 2018-11-09 DIAGNOSIS — R350 Frequency of micturition: Secondary | ICD-10-CM | POA: Diagnosis not present

## 2018-11-09 DIAGNOSIS — I10 Essential (primary) hypertension: Secondary | ICD-10-CM | POA: Diagnosis not present

## 2018-11-09 DIAGNOSIS — E1169 Type 2 diabetes mellitus with other specified complication: Secondary | ICD-10-CM | POA: Diagnosis not present

## 2018-11-09 DIAGNOSIS — D500 Iron deficiency anemia secondary to blood loss (chronic): Secondary | ICD-10-CM | POA: Diagnosis not present

## 2018-11-09 DIAGNOSIS — E538 Deficiency of other specified B group vitamins: Secondary | ICD-10-CM | POA: Diagnosis not present

## 2018-11-15 DIAGNOSIS — E538 Deficiency of other specified B group vitamins: Secondary | ICD-10-CM | POA: Diagnosis not present

## 2018-11-15 DIAGNOSIS — D500 Iron deficiency anemia secondary to blood loss (chronic): Secondary | ICD-10-CM | POA: Diagnosis not present

## 2018-11-15 DIAGNOSIS — R350 Frequency of micturition: Secondary | ICD-10-CM | POA: Diagnosis not present

## 2018-11-15 DIAGNOSIS — E1169 Type 2 diabetes mellitus with other specified complication: Secondary | ICD-10-CM | POA: Diagnosis not present

## 2019-01-18 DIAGNOSIS — L821 Other seborrheic keratosis: Secondary | ICD-10-CM | POA: Diagnosis not present

## 2019-01-18 DIAGNOSIS — D485 Neoplasm of uncertain behavior of skin: Secondary | ICD-10-CM | POA: Diagnosis not present

## 2019-01-18 DIAGNOSIS — L820 Inflamed seborrheic keratosis: Secondary | ICD-10-CM | POA: Diagnosis not present

## 2019-01-18 DIAGNOSIS — L918 Other hypertrophic disorders of the skin: Secondary | ICD-10-CM | POA: Diagnosis not present

## 2019-01-18 DIAGNOSIS — D1801 Hemangioma of skin and subcutaneous tissue: Secondary | ICD-10-CM | POA: Diagnosis not present

## 2019-02-22 DIAGNOSIS — E119 Type 2 diabetes mellitus without complications: Secondary | ICD-10-CM | POA: Diagnosis not present

## 2019-02-22 DIAGNOSIS — H2513 Age-related nuclear cataract, bilateral: Secondary | ICD-10-CM | POA: Diagnosis not present

## 2019-02-22 DIAGNOSIS — Z7984 Long term (current) use of oral hypoglycemic drugs: Secondary | ICD-10-CM | POA: Diagnosis not present

## 2019-02-22 DIAGNOSIS — Z794 Long term (current) use of insulin: Secondary | ICD-10-CM | POA: Diagnosis not present

## 2019-02-25 DIAGNOSIS — N390 Urinary tract infection, site not specified: Secondary | ICD-10-CM | POA: Diagnosis not present

## 2019-04-14 DIAGNOSIS — Z23 Encounter for immunization: Secondary | ICD-10-CM | POA: Diagnosis not present

## 2019-07-04 DIAGNOSIS — R42 Dizziness and giddiness: Secondary | ICD-10-CM | POA: Diagnosis not present

## 2019-07-04 DIAGNOSIS — R05 Cough: Secondary | ICD-10-CM | POA: Diagnosis not present

## 2019-07-16 ENCOUNTER — Emergency Department (HOSPITAL_BASED_OUTPATIENT_CLINIC_OR_DEPARTMENT_OTHER): Payer: Medicare Other

## 2019-07-16 ENCOUNTER — Other Ambulatory Visit: Payer: Self-pay

## 2019-07-16 ENCOUNTER — Emergency Department (HOSPITAL_BASED_OUTPATIENT_CLINIC_OR_DEPARTMENT_OTHER)
Admission: EM | Admit: 2019-07-16 | Discharge: 2019-07-16 | Disposition: A | Discharge status: Home / Self Care | Payer: Medicare Other | Attending: Emergency Medicine | Admitting: Emergency Medicine

## 2019-07-16 ENCOUNTER — Encounter (HOSPITAL_BASED_OUTPATIENT_CLINIC_OR_DEPARTMENT_OTHER): Payer: Self-pay

## 2019-07-16 DIAGNOSIS — E039 Hypothyroidism, unspecified: Secondary | ICD-10-CM | POA: Insufficient documentation

## 2019-07-16 DIAGNOSIS — I10 Essential (primary) hypertension: Secondary | ICD-10-CM | POA: Insufficient documentation

## 2019-07-16 DIAGNOSIS — N12 Tubulo-interstitial nephritis, not specified as acute or chronic: Secondary | ICD-10-CM | POA: Diagnosis not present

## 2019-07-16 DIAGNOSIS — R05 Cough: Secondary | ICD-10-CM | POA: Diagnosis not present

## 2019-07-16 DIAGNOSIS — Z20828 Contact with and (suspected) exposure to other viral communicable diseases: Secondary | ICD-10-CM | POA: Insufficient documentation

## 2019-07-16 DIAGNOSIS — Z20822 Contact with and (suspected) exposure to covid-19: Secondary | ICD-10-CM

## 2019-07-16 DIAGNOSIS — Z7984 Long term (current) use of oral hypoglycemic drugs: Secondary | ICD-10-CM | POA: Diagnosis not present

## 2019-07-16 DIAGNOSIS — Z79899 Other long term (current) drug therapy: Secondary | ICD-10-CM | POA: Diagnosis not present

## 2019-07-16 DIAGNOSIS — E119 Type 2 diabetes mellitus without complications: Secondary | ICD-10-CM | POA: Diagnosis not present

## 2019-07-16 DIAGNOSIS — R112 Nausea with vomiting, unspecified: Secondary | ICD-10-CM | POA: Diagnosis present

## 2019-07-16 LAB — CBC WITH DIFFERENTIAL/PLATELET
Abs Immature Granulocytes: 0.08 10*3/uL — ABNORMAL HIGH (ref 0.00–0.07)
Basophils Absolute: 0 10*3/uL (ref 0.0–0.1)
Basophils Relative: 0 %
Eosinophils Absolute: 0.3 10*3/uL (ref 0.0–0.5)
Eosinophils Relative: 3 %
HCT: 31.9 % — ABNORMAL LOW (ref 36.0–46.0)
Hemoglobin: 10.4 g/dL — ABNORMAL LOW (ref 12.0–15.0)
Immature Granulocytes: 1 %
Lymphocytes Relative: 8 %
Lymphs Abs: 0.9 10*3/uL (ref 0.7–4.0)
MCH: 29.3 pg (ref 26.0–34.0)
MCHC: 32.6 g/dL (ref 30.0–36.0)
MCV: 89.9 fL (ref 80.0–100.0)
Monocytes Absolute: 1 10*3/uL (ref 0.1–1.0)
Monocytes Relative: 9 %
Neutro Abs: 9.2 10*3/uL — ABNORMAL HIGH (ref 1.7–7.7)
Neutrophils Relative %: 79 %
Platelets: 312 10*3/uL (ref 150–400)
RBC: 3.55 MIL/uL — ABNORMAL LOW (ref 3.87–5.11)
RDW: 13.4 % (ref 11.5–15.5)
WBC: 11.6 10*3/uL — ABNORMAL HIGH (ref 4.0–10.5)
nRBC: 0 % (ref 0.0–0.2)

## 2019-07-16 LAB — COMPREHENSIVE METABOLIC PANEL
ALT: 13 U/L (ref 0–44)
AST: 16 U/L (ref 15–41)
Albumin: 3.3 g/dL — ABNORMAL LOW (ref 3.5–5.0)
Alkaline Phosphatase: 64 U/L (ref 38–126)
Anion gap: 12 (ref 5–15)
BUN: 11 mg/dL (ref 8–23)
CO2: 22 mmol/L (ref 22–32)
Calcium: 7.6 mg/dL — ABNORMAL LOW (ref 8.9–10.3)
Chloride: 96 mmol/L — ABNORMAL LOW (ref 98–111)
Creatinine, Ser: 1.11 mg/dL — ABNORMAL HIGH (ref 0.44–1.00)
GFR calc Af Amer: 57 mL/min — ABNORMAL LOW (ref >60)
GFR calc non Af Amer: 50 mL/min — ABNORMAL LOW (ref >60)
Glucose, Bld: 228 mg/dL — ABNORMAL HIGH (ref 70–99)
Potassium: 3.6 mmol/L (ref 3.5–5.1)
Sodium: 130 mmol/L — ABNORMAL LOW (ref 135–145)
Total Bilirubin: 1 mg/dL (ref 0.3–1.2)
Total Protein: 6.9 g/dL (ref 6.5–8.1)

## 2019-07-16 LAB — URINALYSIS, ROUTINE W REFLEX MICROSCOPIC
Bilirubin Urine: NEGATIVE
Glucose, UA: NEGATIVE mg/dL
Ketones, ur: 15 mg/dL — AB
Nitrite: POSITIVE — AB
Protein, ur: 30 mg/dL — AB
Specific Gravity, Urine: 1.02 (ref 1.005–1.030)
pH: 6 (ref 5.0–8.0)

## 2019-07-16 LAB — URINALYSIS, MICROSCOPIC (REFLEX)

## 2019-07-16 LAB — LIPASE, BLOOD: Lipase: 21 U/L (ref 11–51)

## 2019-07-16 LAB — LACTIC ACID, PLASMA: Lactic Acid, Venous: 1.2 mmol/L (ref 0.5–1.9)

## 2019-07-16 LAB — SARS CORONAVIRUS 2 (TAT 6-24 HRS): SARS Coronavirus 2: NEGATIVE

## 2019-07-16 MED ORDER — SODIUM CHLORIDE 0.9 % IV SOLN
1.0000 g | Freq: Once | INTRAVENOUS | Status: AC
  Administered 2019-07-16: 13:00:00 1 g via INTRAVENOUS
  Filled 2019-07-16: qty 10

## 2019-07-16 MED ORDER — SODIUM CHLORIDE 0.9 % IV BOLUS
1000.0000 mL | Freq: Once | INTRAVENOUS | Status: AC
Start: 2019-07-16 — End: 2019-07-16
  Administered 2019-07-16: 1000 mL via INTRAVENOUS

## 2019-07-16 MED ORDER — CEPHALEXIN 500 MG PO CAPS: 500.0000 mg | ORAL_CAPSULE | Freq: Two times a day (BID) | ORAL | 0 refills | Status: AC

## 2019-07-16 MED ORDER — ONDANSETRON HCL 4 MG/2ML IJ SOLN
4.0000 mg | Freq: Once | INTRAMUSCULAR | Status: AC
  Administered 2019-07-16: 4 mg via INTRAVENOUS
  Filled 2019-07-16: qty 2

## 2019-07-16 MED ORDER — ONDANSETRON 4 MG PO TBDP: 4.0000 mg | ORAL_TABLET | Freq: Three times a day (TID) | ORAL | 0 refills | Status: DC | PRN

## 2019-07-16 MED FILL — ONDANSETRON ODT 4 MG TABLET: 4 | 6 days supply | Qty: 20 | Fill #0

## 2019-07-16 MED FILL — CEPHALEXIN 500 MG CAPSULE: 500 | 14 days supply | Qty: 28 | Fill #0

## 2019-07-16 NOTE — ED Notes (Signed)
Pt. On monitor. 

## 2019-07-16 NOTE — ED Triage Notes (Signed)
Pt reports nausea and vomiting  X 2 weeks. Pt reports 4-5 episodes of vomiting in the last 24 hours.

## 2019-07-16 NOTE — ED Notes (Signed)
ED Provider at bedside. 

## 2019-07-16 NOTE — ED Provider Notes (Signed)
[place] EMERGENCY DEPARTMENT Provider Note   CSN: [place]:[id number] Arrival date & time: 07/16/19  1011     History Chief Complaint  Patient presents with  . Abdominal Pain    Sharon Goodman is a 73 y.o. female.  HPI   73yo female with history of hypertension, hyperlipidemia, hypothyroidism, presents with concern for nausea and vomiting for 2 weeks.  Reports has not been able to eat much secondary to nausea and vomiting. Has been able to keep down some gatorade but has not tolerated food.  Varying amounts of vomiting per day, has been more severe in last 24 hr with 4-5 episodes. Has had diarrhea, 2-3 episodes per day.  Epigastric abdominal pain.  Hx of GERD/PUD but does seem different. Mild discomfort that seems to be more nausea than pain.  Has had mild hacking cough since symptoms started but feels it is secondary to nausea.  Does not have urinary symptoms but notes hx of UTIs No known fevers. Had COVID19 testing 1 week ago after having symptoms for a week. Has not had other evaluation.   Past Medical History:  Diagnosis Date  . Diabetes mellitus   . GERD (gastroesophageal reflux disease)    takes prilosec  . Hyperlipidemia   . Hypertension    pt. doe not have  stress test at dr. Thompson Caul or Dr. Sheral Flow office  . Hypothyroidism   . PONV (postoperative nausea and vomiting)     There are no problems to display for this patient.   Past Surgical History:  Procedure Laterality Date  . CHOLECYSTECTOMY    . LUMBAR LAMINECTOMY/DECOMPRESSION MICRODISCECTOMY  06/02/2011   Procedure: LUMBAR LAMINECTOMY/DECOMPRESSION MICRODISCECTOMY;  Surgeon: Otilio Connors;  Location: [facility];  Service: Neurosurgery;  Laterality: Left;  Left L3-4 Laminectomy  . NASAL SEPTUM SURGERY       OB History   No obstetric history on file.     No family history on file.  Social History   Tobacco Use  . Smoking status: Never Smoker  . Smokeless tobacco: Never Used  Substance Use  Topics  . Alcohol use: Yes    Alcohol/week: 0.0 - 1.0 standard drinks  . Drug use: No    Home Medications Prior to Admission medications   Medication Sig Start Date End Date Taking? Authorizing Provider  atenolol (TENORMIN) 25 MG tablet Take 25 mg by mouth daily.      [provider]  cephALEXin (KEFLEX) 500 MG capsule Take 1 capsule (500 mg total) by mouth 2 (two) times daily for 14 days. 07/16/19 07/30/19  Gareth Morgan, MD  insulin glargine (LANTUS) 100 UNIT/ML injection Inject 17 Units into the skin at bedtime.    [provider]  levothyroxine (SYNTHROID, LEVOTHROID) 112 MCG tablet Take 112 mcg by mouth daily.      [provider]  Liraglutide (VICTOZA) 18 MG/3ML SOPN Inject 1.2 mg into the skin daily.     [provider]  LORazepam (ATIVAN) 0.5 MG tablet Take 0.5 mg by mouth daily as needed. For anxiety     [provider]  losartan (COZAAR) 100 MG tablet Take 100 mg by mouth daily. 04/11/15   [provider]  metFORMIN (GLUCOPHAGE) 1000 MG tablet Take 1,000 mg by mouth 2 (two) times daily.      [provider]  ondansetron (ZOFRAN ODT) 4 MG disintegrating tablet Take 1 tablet (4 mg total) by mouth every 8 (eight) hours as needed for nausea or vomiting. 07/16/19  Gareth Morgan, MD  PARoxetine (PAXIL) 40 MG tablet Take 40 mg by mouth daily.      [provider]  pioglitazone (ACTOS) 45 MG tablet Take 45 mg by mouth daily.      [provider]  polyethylene glycol-electrolytes (NULYTELY/GOLYTELY) 420 G solution TAKE AS DIRECTED SEE SHEET FOR INSTRUCTIONS 04/24/15   [provider]  rosuvastatin (CRESTOR) 5 MG tablet Take 5 mg by mouth every other day.      [provider]    Allergies    Lipitor [atorvastatin] and Ramipril  Review of Systems   Review of Systems  Constitutional: Positive for fatigue. Negative for fever.  HENT: Negative for sore throat.   Eyes: Negative for visual  disturbance.  Respiratory: Positive for cough. Negative for shortness of breath.   Cardiovascular: Negative for chest pain.  Gastrointestinal: Positive for abdominal pain, nausea and vomiting.  Genitourinary: Negative for difficulty urinating.  Musculoskeletal: Negative for back pain and neck pain.  Skin: Negative for rash.  Neurological: Negative for syncope and headaches.    Physical Exam Updated Vital Signs BP (!) 149/74   Pulse 93   Temp 99.2 F (37.3 C) (Oral)   Resp (!) 24   Ht 5\' 4"  (1.626 m)   Wt 107 kg   SpO2 100%   BMI 40.51 kg/m   Physical Exam Vitals and nursing note reviewed.  Constitutional:      General: She is not in acute distress.    Appearance: She is well-developed. She is not diaphoretic.  HENT:     Head: Normocephalic and atraumatic.  Eyes:     Conjunctiva/sclera: Conjunctivae normal.  Cardiovascular:     Rate and Rhythm: Normal rate and regular rhythm.     Heart sounds: Normal heart sounds.  Pulmonary:     Effort: Pulmonary effort is normal. No respiratory distress.     Breath sounds: Normal breath sounds. No wheezing or rales.  Abdominal:     General: There is no distension.     Palpations: Abdomen is soft.     Tenderness: There is no abdominal tenderness. There is no guarding.  Musculoskeletal:        General: No tenderness.     Cervical back: Normal range of motion.  Skin:    General: Skin is warm and dry.     Findings: No erythema or rash.  Neurological:     Mental Status: She is alert and oriented to person, place, and time.     ED Results / Procedures / Treatments   Labs (all labs ordered are listed, but only abnormal results are displayed) Labs Reviewed  CBC WITH DIFFERENTIAL/PLATELET - Abnormal; Notable for the following components:      Result Value   WBC 11.6 (*)    RBC 3.55 (*)    Hemoglobin 10.4 (*)    HCT 31.9 (*)    Neutro Abs 9.2 (*)    Abs Immature Granulocytes 0.08 (*)    All other components within normal limits   COMPREHENSIVE METABOLIC PANEL - Abnormal; Notable for the following components:   Sodium 130 (*)    Chloride 96 (*)    Glucose, Bld 228 (*)    Creatinine, Ser 1.11 (*)    Calcium 7.6 (*)    Albumin 3.3 (*)    GFR calc non Af Amer 50 (*)    GFR calc Af Amer 57 (*)    All other components within normal limits  URINALYSIS, ROUTINE W REFLEX MICROSCOPIC -  Abnormal; Notable for the following components:   APPearance CLOUDY (*)    Hgb urine dipstick MODERATE (*)    Ketones, ur 15 (*)    Protein, ur 30 (*)    Nitrite POSITIVE (*)    Leukocytes,Ua SMALL (*)    All other components within normal limits  URINALYSIS, MICROSCOPIC (REFLEX) - Abnormal; Notable for the following components:   Bacteria, UA MANY (*)    All other components within normal limits  URINE CULTURE  SARS CORONAVIRUS 2 (TAT 6-24 HRS)  CULTURE, BLOOD (ROUTINE X 2)  CULTURE, BLOOD (ROUTINE X 2)  LIPASE, BLOOD  LACTIC ACID, PLASMA  LACTIC ACID, PLASMA    EKG EKG Interpretation  Date/Time:  Tuesday July 16 2019 11:55:03 EST Ventricular Rate:  88 PR Interval:    QRS Duration: 81 QT Interval:  364 QTC Calculation: 441 R Axis:   47 Text Interpretation: Sinus rhythm Low voltage, precordial leads No significant change since last tracing Confirmed by Gareth Morgan [phone]) on 07/16/2019 1:25:57 PM   Radiology DG Chest Portable 1 View  Result Date: 07/16/2019 CLINICAL DATA:  Cough and nausea and vomiting. EXAM: PORTABLE CHEST 1 VIEW COMPARISON:  06/01/2011 FINDINGS: The cardiac silhouette, mediastinal and hilar contours are within normal limits given the AP projection and portable technique. There is mild tortuosity and calcification of the thoracic aorta. Mild eventration of both hemidiaphragms with some overlying vascular crowding and streaky atelectasis. No definite infiltrates or effusions. The bony thorax is intact. IMPRESSION: Streaky basilar atelectasis but no infiltrates or effusions. Electronically Signed    By: Marijo Sanes M.D.   On: 07/16/2019 11:28    Procedures Procedures (including critical care time)  Medications Ordered in ED Medications  sodium chloride 0.9 % bolus 1,000 mL ( Intravenous Stopped 07/16/19 1243)  ondansetron (ZOFRAN) injection 4 mg (4 mg Intravenous Given 07/16/19 1137)  cefTRIAXone (ROCEPHIN) 1 g in sodium chloride 0.9 % 100 mL IVPB (0 g Intravenous Stopped 07/16/19 1506)    ED Course  I have reviewed the triage vital signs and the nursing notes.  Pertinent labs & imaging results that were available during my care of the patient were reviewed by me and considered in my medical decision making (see chart for details).    MDM Rules/Calculators/A&P                       73yo female with history of hypertension, hyperlipidemia, hypothyroidism, presents with concern for nausea and vomiting for 2 weeks.  No significant abdominal pain and no tenderness on exam, doubt appendicitis, perforated peptic ulcer, hx and exam not consistent with SBO, diverticulitis, ACS.  Consider UTI or gastroenteritis.    Labs show mild hyperglycemia without DKA, Cr 1.11. CXR without acute findings.  UA consistent with UTI.  Suspect n/v secondary to UTI/pyelonephritis.Given rocephin.  Pt tolerating po in the ED following zofran and feels improved.  Lactate WNL. Pt stable for outpatient treatment. Given keflex and zofran rx.  COVID test also pending, done in case of admission but given pt with cough, vomiting, diarrhea also recommend continued quarantine until results return. Patient discharged in stable condition with understanding of reasons to return.    Final Clinical Impression(s) / ED Diagnoses Final diagnoses:  Non-intractable vomiting with nausea, unspecified vomiting type  Pyelonephritis  COVID-19 virus test result unknown    Rx / DC Orders ED Discharge Orders         Ordered    ondansetron (ZOFRAN ODT) 4 MG  disintegrating tablet  Every 8 hours PRN     07/16/19 1500     cephALEXin (KEFLEX) 500 MG capsule  2 times daily     07/16/19 1500           Gareth Morgan, MD 07/16/19 1811

## 2019-07-18 LAB — URINE CULTURE: Culture: >=100000 — AB

## 2019-07-21 LAB — CULTURE, BLOOD (ROUTINE X 2)
Culture: NO GROWTH
Special Requests: ADEQUATE

## 2019-08-15 DIAGNOSIS — R351 Nocturia: Secondary | ICD-10-CM | POA: Diagnosis not present

## 2019-08-15 DIAGNOSIS — N3281 Overactive bladder: Secondary | ICD-10-CM | POA: Diagnosis not present

## 2019-08-16 ENCOUNTER — Other Ambulatory Visit: Payer: Self-pay | Admitting: Obstetrics & Gynecology

## 2019-08-16 DIAGNOSIS — E2839 Other primary ovarian failure: Secondary | ICD-10-CM

## 2019-08-20 ENCOUNTER — Other Ambulatory Visit: Payer: Self-pay | Admitting: Internal Medicine

## 2019-08-20 DIAGNOSIS — Z1231 Encounter for screening mammogram for malignant neoplasm of breast: Secondary | ICD-10-CM

## 2019-09-04 DIAGNOSIS — R8279 Other abnormal findings on microbiological examination of urine: Secondary | ICD-10-CM | POA: Diagnosis not present

## 2019-09-04 DIAGNOSIS — N3020 Other chronic cystitis without hematuria: Secondary | ICD-10-CM | POA: Diagnosis not present

## 2019-09-10 DIAGNOSIS — Z23 Encounter for immunization: Secondary | ICD-10-CM | POA: Diagnosis not present

## 2019-09-27 ENCOUNTER — Other Ambulatory Visit: Payer: Self-pay

## 2019-09-27 ENCOUNTER — Ambulatory Visit
Admission: RE | Admit: 2019-09-27 | Discharge: 2019-09-27 | Disposition: A | Discharge status: Home / Self Care | Payer: Medicare Other | Source: Ambulatory Visit | Attending: Internal Medicine | Admitting: Internal Medicine

## 2019-09-27 DIAGNOSIS — E782 Mixed hyperlipidemia: Secondary | ICD-10-CM | POA: Diagnosis not present

## 2019-09-27 DIAGNOSIS — Z1231 Encounter for screening mammogram for malignant neoplasm of breast: Secondary | ICD-10-CM | POA: Diagnosis not present

## 2019-09-27 DIAGNOSIS — Z0000 Encounter for general adult medical examination without abnormal findings: Secondary | ICD-10-CM | POA: Diagnosis not present

## 2019-09-27 DIAGNOSIS — G4733 Obstructive sleep apnea (adult) (pediatric): Secondary | ICD-10-CM | POA: Diagnosis not present

## 2019-09-27 DIAGNOSIS — Z6841 Body Mass Index (BMI) 40.0 and over, adult: Secondary | ICD-10-CM | POA: Diagnosis not present

## 2019-09-27 DIAGNOSIS — E039 Hypothyroidism, unspecified: Secondary | ICD-10-CM | POA: Diagnosis not present

## 2019-09-27 DIAGNOSIS — K257 Chronic gastric ulcer without hemorrhage or perforation: Secondary | ICD-10-CM | POA: Diagnosis not present

## 2019-09-27 DIAGNOSIS — D500 Iron deficiency anemia secondary to blood loss (chronic): Secondary | ICD-10-CM | POA: Diagnosis not present

## 2019-09-27 DIAGNOSIS — E1169 Type 2 diabetes mellitus with other specified complication: Secondary | ICD-10-CM | POA: Diagnosis not present

## 2019-09-27 DIAGNOSIS — E538 Deficiency of other specified B group vitamins: Secondary | ICD-10-CM | POA: Diagnosis not present

## 2019-09-27 DIAGNOSIS — Z1389 Encounter for screening for other disorder: Secondary | ICD-10-CM | POA: Diagnosis not present

## 2019-09-27 DIAGNOSIS — Z794 Long term (current) use of insulin: Secondary | ICD-10-CM | POA: Diagnosis not present

## 2019-09-27 DIAGNOSIS — K219 Gastro-esophageal reflux disease without esophagitis: Secondary | ICD-10-CM | POA: Diagnosis not present

## 2019-09-27 DIAGNOSIS — I10 Essential (primary) hypertension: Secondary | ICD-10-CM | POA: Diagnosis not present

## 2019-10-04 DIAGNOSIS — Z23 Encounter for immunization: Secondary | ICD-10-CM | POA: Diagnosis not present

## 2019-10-31 ENCOUNTER — Ambulatory Visit
Admission: RE | Admit: 2019-10-31 | Discharge: 2019-10-31 | Disposition: A | Discharge status: Home / Self Care | Payer: Medicare Other | Source: Ambulatory Visit | Attending: Obstetrics & Gynecology | Admitting: Obstetrics & Gynecology

## 2019-10-31 ENCOUNTER — Other Ambulatory Visit: Payer: Self-pay

## 2019-10-31 DIAGNOSIS — E2839 Other primary ovarian failure: Secondary | ICD-10-CM

## 2019-10-31 DIAGNOSIS — Z780 Asymptomatic menopausal state: Secondary | ICD-10-CM | POA: Diagnosis not present

## 2019-11-08 DIAGNOSIS — E039 Hypothyroidism, unspecified: Secondary | ICD-10-CM | POA: Diagnosis not present

## 2020-01-09 DIAGNOSIS — H6123 Impacted cerumen, bilateral: Secondary | ICD-10-CM | POA: Diagnosis not present

## 2020-02-27 DIAGNOSIS — E119 Type 2 diabetes mellitus without complications: Secondary | ICD-10-CM | POA: Diagnosis not present

## 2020-02-27 DIAGNOSIS — Z7984 Long term (current) use of oral hypoglycemic drugs: Secondary | ICD-10-CM | POA: Diagnosis not present

## 2020-02-27 DIAGNOSIS — H35373 Puckering of macula, bilateral: Secondary | ICD-10-CM | POA: Diagnosis not present

## 2020-02-27 DIAGNOSIS — Z794 Long term (current) use of insulin: Secondary | ICD-10-CM | POA: Diagnosis not present

## 2020-03-02 DIAGNOSIS — E1169 Type 2 diabetes mellitus with other specified complication: Secondary | ICD-10-CM | POA: Diagnosis not present

## 2020-03-02 DIAGNOSIS — R42 Dizziness and giddiness: Secondary | ICD-10-CM | POA: Diagnosis not present

## 2020-03-02 DIAGNOSIS — I10 Essential (primary) hypertension: Secondary | ICD-10-CM | POA: Diagnosis not present

## 2020-03-02 DIAGNOSIS — D500 Iron deficiency anemia secondary to blood loss (chronic): Secondary | ICD-10-CM | POA: Diagnosis not present

## 2020-03-02 DIAGNOSIS — K219 Gastro-esophageal reflux disease without esophagitis: Secondary | ICD-10-CM | POA: Diagnosis not present

## 2020-03-02 DIAGNOSIS — F325 Major depressive disorder, single episode, in full remission: Secondary | ICD-10-CM | POA: Diagnosis not present

## 2020-03-02 DIAGNOSIS — Z794 Long term (current) use of insulin: Secondary | ICD-10-CM | POA: Diagnosis not present

## 2020-03-05 DIAGNOSIS — H25812 Combined forms of age-related cataract, left eye: Secondary | ICD-10-CM | POA: Diagnosis not present

## 2020-03-05 DIAGNOSIS — H35372 Puckering of macula, left eye: Secondary | ICD-10-CM | POA: Diagnosis not present

## 2020-03-05 DIAGNOSIS — Z01818 Encounter for other preprocedural examination: Secondary | ICD-10-CM | POA: Diagnosis not present

## 2020-03-05 DIAGNOSIS — H25811 Combined forms of age-related cataract, right eye: Secondary | ICD-10-CM | POA: Diagnosis not present

## 2020-03-05 DIAGNOSIS — E119 Type 2 diabetes mellitus without complications: Secondary | ICD-10-CM | POA: Diagnosis not present

## 2020-03-20 DIAGNOSIS — H2511 Age-related nuclear cataract, right eye: Secondary | ICD-10-CM | POA: Diagnosis not present

## 2020-03-20 DIAGNOSIS — H25811 Combined forms of age-related cataract, right eye: Secondary | ICD-10-CM | POA: Diagnosis not present

## 2020-03-20 DIAGNOSIS — H25812 Combined forms of age-related cataract, left eye: Secondary | ICD-10-CM | POA: Diagnosis not present

## 2020-04-09 DIAGNOSIS — H2512 Age-related nuclear cataract, left eye: Secondary | ICD-10-CM | POA: Diagnosis not present

## 2020-04-09 DIAGNOSIS — H25812 Combined forms of age-related cataract, left eye: Secondary | ICD-10-CM | POA: Diagnosis not present

## 2020-04-21 DIAGNOSIS — Z23 Encounter for immunization: Secondary | ICD-10-CM | POA: Diagnosis not present

## 2020-04-30 DIAGNOSIS — Z23 Encounter for immunization: Secondary | ICD-10-CM | POA: Diagnosis not present

## 2020-05-28 DIAGNOSIS — G4733 Obstructive sleep apnea (adult) (pediatric): Secondary | ICD-10-CM | POA: Diagnosis not present

## 2020-07-02 DIAGNOSIS — K219 Gastro-esophageal reflux disease without esophagitis: Secondary | ICD-10-CM | POA: Diagnosis not present

## 2020-07-02 DIAGNOSIS — E1169 Type 2 diabetes mellitus with other specified complication: Secondary | ICD-10-CM | POA: Diagnosis not present

## 2020-07-02 DIAGNOSIS — I10 Essential (primary) hypertension: Secondary | ICD-10-CM | POA: Diagnosis not present

## 2020-07-02 DIAGNOSIS — E039 Hypothyroidism, unspecified: Secondary | ICD-10-CM | POA: Diagnosis not present

## 2020-07-02 DIAGNOSIS — R490 Dysphonia: Secondary | ICD-10-CM | POA: Diagnosis not present

## 2020-08-07 DIAGNOSIS — K219 Gastro-esophageal reflux disease without esophagitis: Secondary | ICD-10-CM | POA: Diagnosis not present

## 2020-08-07 DIAGNOSIS — R490 Dysphonia: Secondary | ICD-10-CM | POA: Diagnosis not present

## 2020-08-20 DIAGNOSIS — M25571 Pain in right ankle and joints of right foot: Secondary | ICD-10-CM | POA: Diagnosis not present

## 2020-08-21 DIAGNOSIS — I10 Essential (primary) hypertension: Secondary | ICD-10-CM | POA: Diagnosis not present

## 2020-08-21 DIAGNOSIS — E039 Hypothyroidism, unspecified: Secondary | ICD-10-CM | POA: Diagnosis not present

## 2020-08-21 DIAGNOSIS — D500 Iron deficiency anemia secondary to blood loss (chronic): Secondary | ICD-10-CM | POA: Diagnosis not present

## 2020-08-21 DIAGNOSIS — K219 Gastro-esophageal reflux disease without esophagitis: Secondary | ICD-10-CM | POA: Diagnosis not present

## 2020-08-21 DIAGNOSIS — E782 Mixed hyperlipidemia: Secondary | ICD-10-CM | POA: Diagnosis not present

## 2020-08-21 DIAGNOSIS — E1169 Type 2 diabetes mellitus with other specified complication: Secondary | ICD-10-CM | POA: Diagnosis not present

## 2020-08-21 DIAGNOSIS — F325 Major depressive disorder, single episode, in full remission: Secondary | ICD-10-CM | POA: Diagnosis not present

## 2020-08-24 DIAGNOSIS — S92301A Fracture of unspecified metatarsal bone(s), right foot, initial encounter for closed fracture: Secondary | ICD-10-CM | POA: Diagnosis not present

## 2020-09-01 DIAGNOSIS — X58XXXA Exposure to other specified factors, initial encounter: Secondary | ICD-10-CM | POA: Diagnosis not present

## 2020-09-01 DIAGNOSIS — X509XXA Other and unspecified overexertion or strenuous movements or postures, initial encounter: Secondary | ICD-10-CM | POA: Diagnosis not present

## 2020-09-01 DIAGNOSIS — S92351A Displaced fracture of fifth metatarsal bone, right foot, initial encounter for closed fracture: Secondary | ICD-10-CM | POA: Diagnosis not present

## 2020-09-01 DIAGNOSIS — W1840XA Slipping, tripping and stumbling without falling, unspecified, initial encounter: Secondary | ICD-10-CM | POA: Diagnosis not present

## 2020-09-01 DIAGNOSIS — S92341A Displaced fracture of fourth metatarsal bone, right foot, initial encounter for closed fracture: Secondary | ICD-10-CM | POA: Diagnosis not present

## 2020-09-01 DIAGNOSIS — G8918 Other acute postprocedural pain: Secondary | ICD-10-CM | POA: Diagnosis not present

## 2020-09-16 DIAGNOSIS — E1169 Type 2 diabetes mellitus with other specified complication: Secondary | ICD-10-CM | POA: Diagnosis not present

## 2020-09-16 DIAGNOSIS — S92351A Displaced fracture of fifth metatarsal bone, right foot, initial encounter for closed fracture: Secondary | ICD-10-CM | POA: Diagnosis not present

## 2020-09-16 DIAGNOSIS — E782 Mixed hyperlipidemia: Secondary | ICD-10-CM | POA: Diagnosis not present

## 2020-09-16 DIAGNOSIS — S92341A Displaced fracture of fourth metatarsal bone, right foot, initial encounter for closed fracture: Secondary | ICD-10-CM | POA: Diagnosis not present

## 2020-09-16 DIAGNOSIS — F325 Major depressive disorder, single episode, in full remission: Secondary | ICD-10-CM | POA: Diagnosis not present

## 2020-09-16 DIAGNOSIS — I10 Essential (primary) hypertension: Secondary | ICD-10-CM | POA: Diagnosis not present

## 2020-09-16 DIAGNOSIS — E039 Hypothyroidism, unspecified: Secondary | ICD-10-CM | POA: Diagnosis not present

## 2020-09-16 DIAGNOSIS — D500 Iron deficiency anemia secondary to blood loss (chronic): Secondary | ICD-10-CM | POA: Diagnosis not present

## 2020-09-16 DIAGNOSIS — K219 Gastro-esophageal reflux disease without esophagitis: Secondary | ICD-10-CM | POA: Diagnosis not present

## 2020-09-21 DIAGNOSIS — E1169 Type 2 diabetes mellitus with other specified complication: Secondary | ICD-10-CM | POA: Diagnosis not present

## 2020-09-30 DIAGNOSIS — S92341A Displaced fracture of fourth metatarsal bone, right foot, initial encounter for closed fracture: Secondary | ICD-10-CM | POA: Diagnosis not present

## 2020-09-30 DIAGNOSIS — S92351A Displaced fracture of fifth metatarsal bone, right foot, initial encounter for closed fracture: Secondary | ICD-10-CM | POA: Diagnosis not present

## 2020-10-22 DIAGNOSIS — E1169 Type 2 diabetes mellitus with other specified complication: Secondary | ICD-10-CM | POA: Diagnosis not present

## 2020-10-22 DIAGNOSIS — E039 Hypothyroidism, unspecified: Secondary | ICD-10-CM | POA: Diagnosis not present

## 2020-10-22 DIAGNOSIS — I10 Essential (primary) hypertension: Secondary | ICD-10-CM | POA: Diagnosis not present

## 2020-10-22 DIAGNOSIS — D500 Iron deficiency anemia secondary to blood loss (chronic): Secondary | ICD-10-CM | POA: Diagnosis not present

## 2020-10-22 DIAGNOSIS — F325 Major depressive disorder, single episode, in full remission: Secondary | ICD-10-CM | POA: Diagnosis not present

## 2020-10-22 DIAGNOSIS — E782 Mixed hyperlipidemia: Secondary | ICD-10-CM | POA: Diagnosis not present

## 2020-10-22 DIAGNOSIS — K219 Gastro-esophageal reflux disease without esophagitis: Secondary | ICD-10-CM | POA: Diagnosis not present

## 2020-10-28 DIAGNOSIS — S92341A Displaced fracture of fourth metatarsal bone, right foot, initial encounter for closed fracture: Secondary | ICD-10-CM | POA: Diagnosis not present

## 2020-10-28 DIAGNOSIS — S92351A Displaced fracture of fifth metatarsal bone, right foot, initial encounter for closed fracture: Secondary | ICD-10-CM | POA: Diagnosis not present

## 2020-11-05 DIAGNOSIS — G4733 Obstructive sleep apnea (adult) (pediatric): Secondary | ICD-10-CM | POA: Diagnosis not present

## 2020-11-10 ENCOUNTER — Other Ambulatory Visit: Payer: Self-pay | Admitting: Internal Medicine

## 2020-11-10 DIAGNOSIS — Z1231 Encounter for screening mammogram for malignant neoplasm of breast: Secondary | ICD-10-CM

## 2020-11-12 DIAGNOSIS — Z1389 Encounter for screening for other disorder: Secondary | ICD-10-CM | POA: Diagnosis not present

## 2020-11-12 DIAGNOSIS — E538 Deficiency of other specified B group vitamins: Secondary | ICD-10-CM | POA: Diagnosis not present

## 2020-11-12 DIAGNOSIS — Z0000 Encounter for general adult medical examination without abnormal findings: Secondary | ICD-10-CM | POA: Diagnosis not present

## 2020-11-12 DIAGNOSIS — E1169 Type 2 diabetes mellitus with other specified complication: Secondary | ICD-10-CM | POA: Diagnosis not present

## 2020-11-12 DIAGNOSIS — F325 Major depressive disorder, single episode, in full remission: Secondary | ICD-10-CM | POA: Diagnosis not present

## 2020-11-12 DIAGNOSIS — G4733 Obstructive sleep apnea (adult) (pediatric): Secondary | ICD-10-CM | POA: Diagnosis not present

## 2020-11-12 DIAGNOSIS — I10 Essential (primary) hypertension: Secondary | ICD-10-CM | POA: Diagnosis not present

## 2020-11-12 DIAGNOSIS — K219 Gastro-esophageal reflux disease without esophagitis: Secondary | ICD-10-CM | POA: Diagnosis not present

## 2020-11-12 DIAGNOSIS — E782 Mixed hyperlipidemia: Secondary | ICD-10-CM | POA: Diagnosis not present

## 2020-11-12 DIAGNOSIS — D500 Iron deficiency anemia secondary to blood loss (chronic): Secondary | ICD-10-CM | POA: Diagnosis not present

## 2020-11-12 DIAGNOSIS — Z794 Long term (current) use of insulin: Secondary | ICD-10-CM | POA: Diagnosis not present

## 2020-11-12 DIAGNOSIS — E039 Hypothyroidism, unspecified: Secondary | ICD-10-CM | POA: Diagnosis not present

## 2020-11-12 DIAGNOSIS — R490 Dysphonia: Secondary | ICD-10-CM | POA: Diagnosis not present

## 2020-11-19 DIAGNOSIS — Z23 Encounter for immunization: Secondary | ICD-10-CM | POA: Diagnosis not present

## 2020-11-20 DIAGNOSIS — E1169 Type 2 diabetes mellitus with other specified complication: Secondary | ICD-10-CM | POA: Diagnosis not present

## 2020-12-15 DIAGNOSIS — K219 Gastro-esophageal reflux disease without esophagitis: Secondary | ICD-10-CM | POA: Diagnosis not present

## 2020-12-15 DIAGNOSIS — D500 Iron deficiency anemia secondary to blood loss (chronic): Secondary | ICD-10-CM | POA: Diagnosis not present

## 2020-12-15 DIAGNOSIS — I10 Essential (primary) hypertension: Secondary | ICD-10-CM | POA: Diagnosis not present

## 2020-12-15 DIAGNOSIS — E1169 Type 2 diabetes mellitus with other specified complication: Secondary | ICD-10-CM | POA: Diagnosis not present

## 2020-12-15 DIAGNOSIS — E782 Mixed hyperlipidemia: Secondary | ICD-10-CM | POA: Diagnosis not present

## 2020-12-15 DIAGNOSIS — F325 Major depressive disorder, single episode, in full remission: Secondary | ICD-10-CM | POA: Diagnosis not present

## 2020-12-15 DIAGNOSIS — E039 Hypothyroidism, unspecified: Secondary | ICD-10-CM | POA: Diagnosis not present

## 2020-12-22 DIAGNOSIS — E1169 Type 2 diabetes mellitus with other specified complication: Secondary | ICD-10-CM | POA: Diagnosis not present

## 2020-12-31 ENCOUNTER — Ambulatory Visit
Admission: RE | Admit: 2020-12-31 | Discharge: 2020-12-31 | Disposition: A | Discharge status: Home / Self Care | Payer: Medicare Other | Source: Ambulatory Visit | Attending: Internal Medicine | Admitting: Internal Medicine

## 2020-12-31 ENCOUNTER — Other Ambulatory Visit: Payer: Self-pay

## 2020-12-31 DIAGNOSIS — Z1231 Encounter for screening mammogram for malignant neoplasm of breast: Secondary | ICD-10-CM

## 2021-01-06 DIAGNOSIS — S92341D Displaced fracture of fourth metatarsal bone, right foot, subsequent encounter for fracture with routine healing: Secondary | ICD-10-CM | POA: Diagnosis not present

## 2021-01-07 DIAGNOSIS — Z01419 Encounter for gynecological examination (general) (routine) without abnormal findings: Secondary | ICD-10-CM | POA: Diagnosis not present

## 2021-01-07 DIAGNOSIS — D229 Melanocytic nevi, unspecified: Secondary | ICD-10-CM | POA: Diagnosis not present

## 2021-01-21 DIAGNOSIS — E1169 Type 2 diabetes mellitus with other specified complication: Secondary | ICD-10-CM | POA: Diagnosis not present

## 2021-02-05 IMAGING — MG DIGITAL SCREENING BILAT W/ TOMO W/ CAD
6 of 10 series · 6 of 30 positions shown · non-contrast
Comparison: Previous exam(s).

CLINICAL DATA: Screening.

EXAM:
DIGITAL SCREENING BILATERAL MAMMOGRAM WITH TOMO AND CAD

[R CC synth-2D]
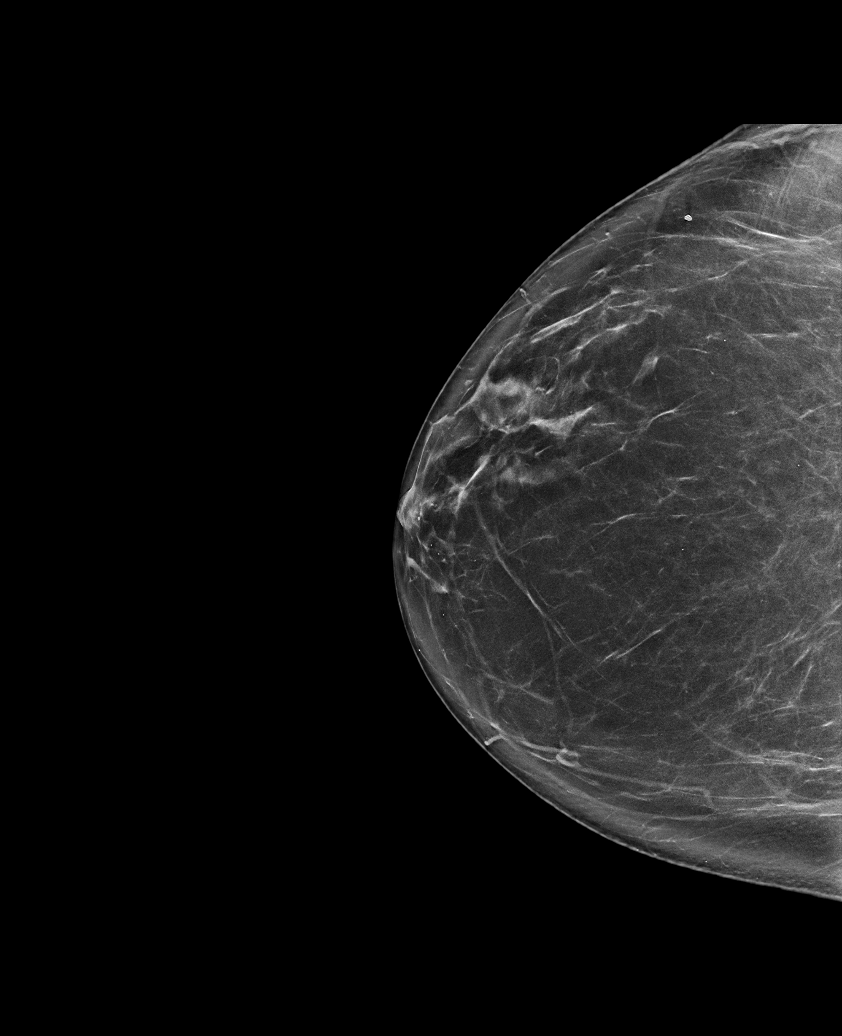

[L MLO synth-2D (1 of 2)]
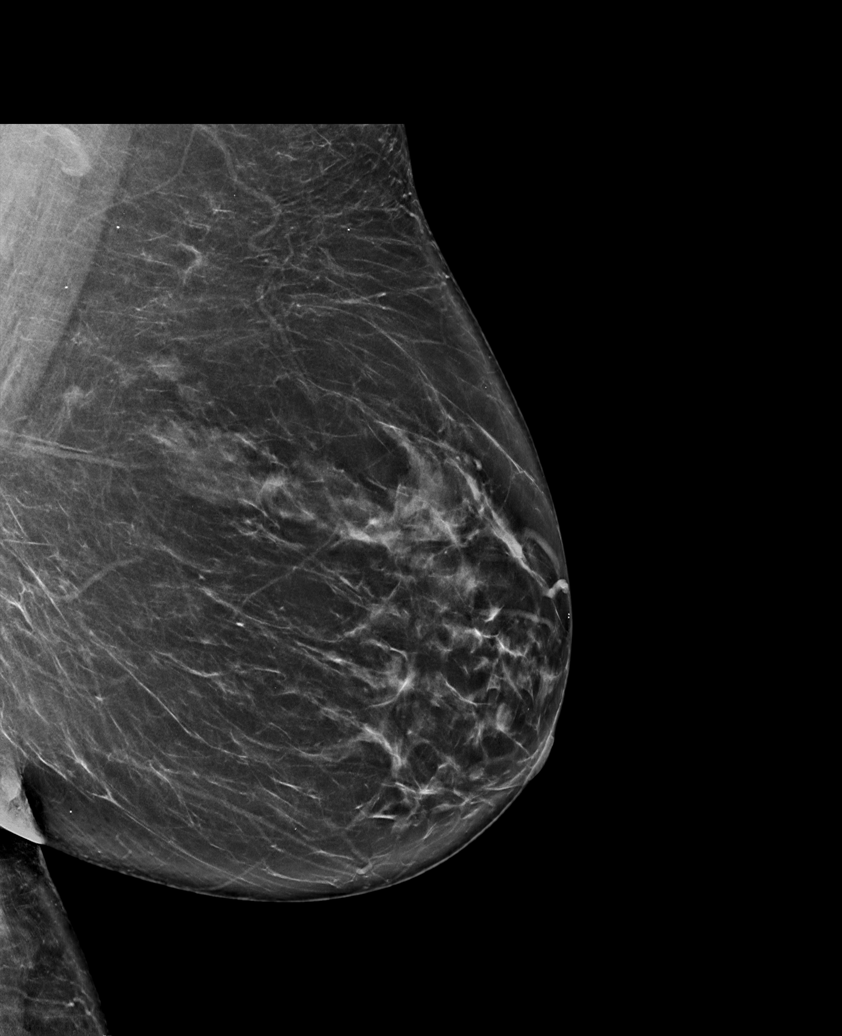

[L CC synth-2D]
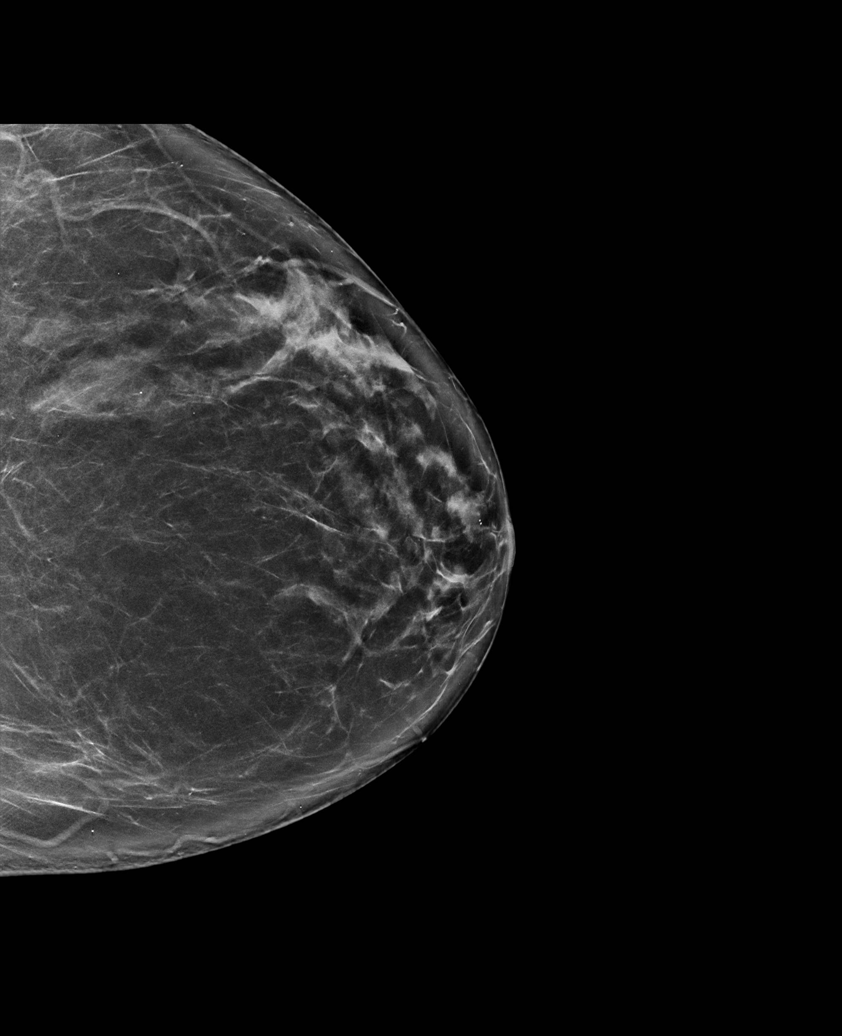

[R MLO synth-2D]
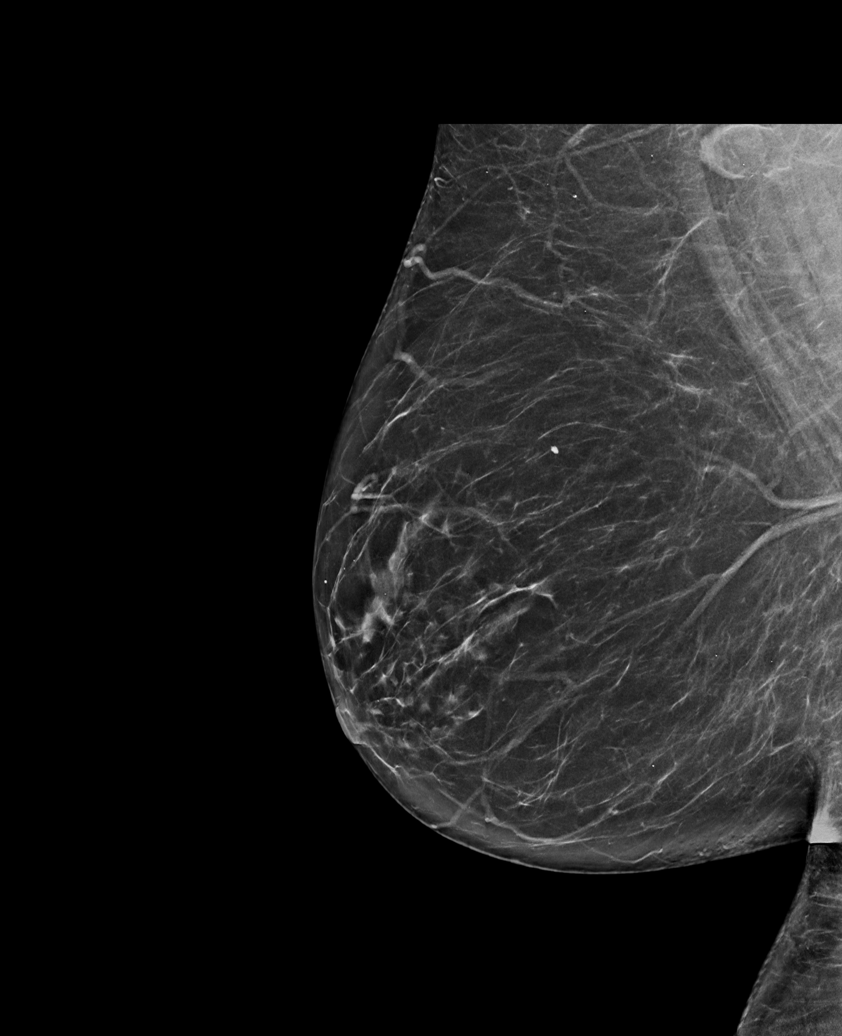

[L MLO synth-2D (2 of 2)]
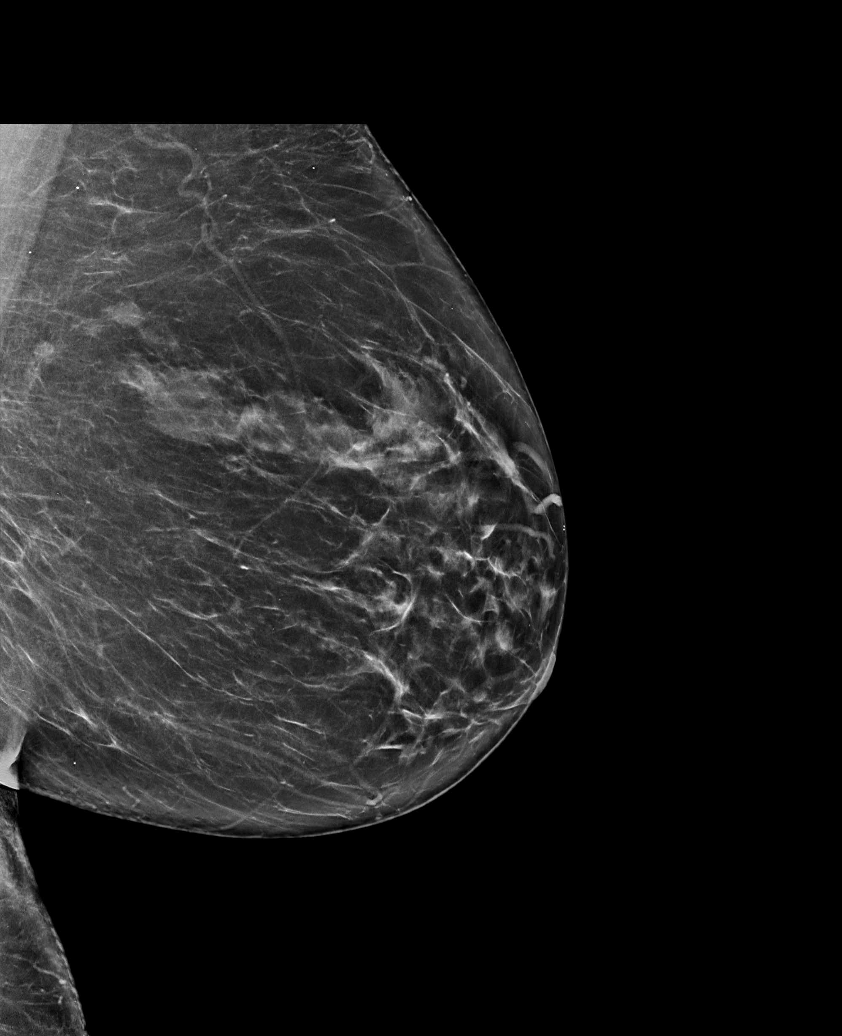

[R MLO tomo · tomo slice 41/80.0]
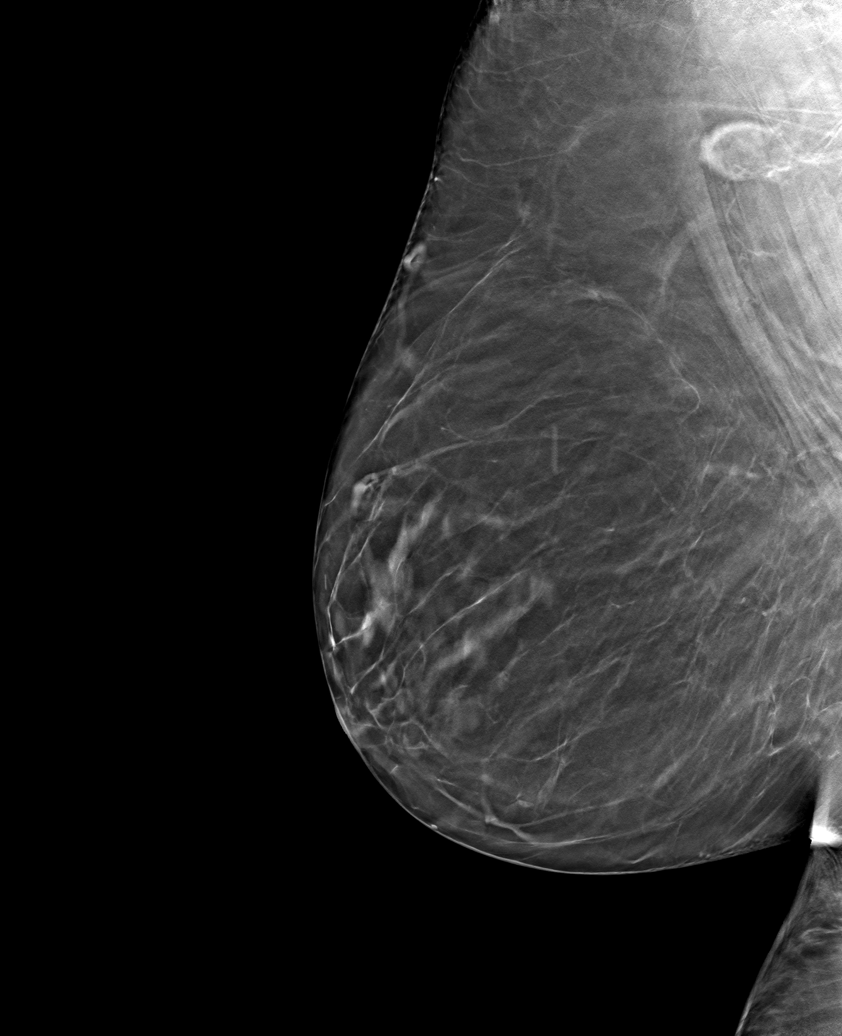

[6 of 30 positions shown; findings below may reference images not displayed]

ACR Breast Density Category b: There are scattered areas of
fibroglandular density.
FINDINGS: There are no findings suspicious for malignancy. Images were
processed with CAD.
IMPRESSION: No mammographic evidence of malignancy. A result letter of this
screening mammogram will be mailed directly to the patient.

RECOMMENDATION:
Screening mammogram in one year. (Code:CN-U-775)

BI-RADS CATEGORY  1: Negative.

## 2021-02-16 DIAGNOSIS — Z20822 Contact with and (suspected) exposure to covid-19: Secondary | ICD-10-CM | POA: Diagnosis not present

## 2021-02-18 DIAGNOSIS — E1169 Type 2 diabetes mellitus with other specified complication: Secondary | ICD-10-CM | POA: Diagnosis not present

## 2021-02-18 DIAGNOSIS — I10 Essential (primary) hypertension: Secondary | ICD-10-CM | POA: Diagnosis not present

## 2021-02-18 DIAGNOSIS — E782 Mixed hyperlipidemia: Secondary | ICD-10-CM | POA: Diagnosis not present

## 2021-02-18 DIAGNOSIS — K219 Gastro-esophageal reflux disease without esophagitis: Secondary | ICD-10-CM | POA: Diagnosis not present

## 2021-02-18 DIAGNOSIS — F325 Major depressive disorder, single episode, in full remission: Secondary | ICD-10-CM | POA: Diagnosis not present

## 2021-02-18 DIAGNOSIS — E039 Hypothyroidism, unspecified: Secondary | ICD-10-CM | POA: Diagnosis not present

## 2021-02-18 DIAGNOSIS — D500 Iron deficiency anemia secondary to blood loss (chronic): Secondary | ICD-10-CM | POA: Diagnosis not present

## 2021-02-19 DIAGNOSIS — E1169 Type 2 diabetes mellitus with other specified complication: Secondary | ICD-10-CM | POA: Diagnosis not present

## 2021-02-22 DIAGNOSIS — Z20822 Contact with and (suspected) exposure to covid-19: Secondary | ICD-10-CM | POA: Diagnosis not present

## 2021-03-11 DIAGNOSIS — I10 Essential (primary) hypertension: Secondary | ICD-10-CM | POA: Diagnosis not present

## 2021-03-11 DIAGNOSIS — F325 Major depressive disorder, single episode, in full remission: Secondary | ICD-10-CM | POA: Diagnosis not present

## 2021-03-11 DIAGNOSIS — G4733 Obstructive sleep apnea (adult) (pediatric): Secondary | ICD-10-CM | POA: Diagnosis not present

## 2021-03-11 DIAGNOSIS — E1169 Type 2 diabetes mellitus with other specified complication: Secondary | ICD-10-CM | POA: Diagnosis not present

## 2021-03-11 DIAGNOSIS — Z794 Long term (current) use of insulin: Secondary | ICD-10-CM | POA: Diagnosis not present

## 2021-03-24 DIAGNOSIS — E1169 Type 2 diabetes mellitus with other specified complication: Secondary | ICD-10-CM | POA: Diagnosis not present

## 2021-03-25 DIAGNOSIS — Z20822 Contact with and (suspected) exposure to covid-19: Secondary | ICD-10-CM | POA: Diagnosis not present

## 2021-04-06 DIAGNOSIS — R110 Nausea: Secondary | ICD-10-CM | POA: Diagnosis not present

## 2021-04-06 DIAGNOSIS — N390 Urinary tract infection, site not specified: Secondary | ICD-10-CM | POA: Diagnosis not present

## 2021-04-06 DIAGNOSIS — M545 Low back pain, unspecified: Secondary | ICD-10-CM | POA: Diagnosis not present

## 2021-04-15 DIAGNOSIS — I10 Essential (primary) hypertension: Secondary | ICD-10-CM | POA: Diagnosis not present

## 2021-04-15 DIAGNOSIS — F325 Major depressive disorder, single episode, in full remission: Secondary | ICD-10-CM | POA: Diagnosis not present

## 2021-04-15 DIAGNOSIS — E782 Mixed hyperlipidemia: Secondary | ICD-10-CM | POA: Diagnosis not present

## 2021-04-15 DIAGNOSIS — E1169 Type 2 diabetes mellitus with other specified complication: Secondary | ICD-10-CM | POA: Diagnosis not present

## 2021-04-15 DIAGNOSIS — D500 Iron deficiency anemia secondary to blood loss (chronic): Secondary | ICD-10-CM | POA: Diagnosis not present

## 2021-04-15 DIAGNOSIS — K219 Gastro-esophageal reflux disease without esophagitis: Secondary | ICD-10-CM | POA: Diagnosis not present

## 2021-04-15 DIAGNOSIS — E039 Hypothyroidism, unspecified: Secondary | ICD-10-CM | POA: Diagnosis not present

## 2021-04-24 DIAGNOSIS — Z20822 Contact with and (suspected) exposure to covid-19: Secondary | ICD-10-CM | POA: Diagnosis not present

## 2021-05-08 DIAGNOSIS — Z23 Encounter for immunization: Secondary | ICD-10-CM | POA: Diagnosis not present

## 2021-05-13 DIAGNOSIS — D500 Iron deficiency anemia secondary to blood loss (chronic): Secondary | ICD-10-CM | POA: Diagnosis not present

## 2021-05-13 DIAGNOSIS — K219 Gastro-esophageal reflux disease without esophagitis: Secondary | ICD-10-CM | POA: Diagnosis not present

## 2021-05-13 DIAGNOSIS — E1169 Type 2 diabetes mellitus with other specified complication: Secondary | ICD-10-CM | POA: Diagnosis not present

## 2021-05-13 DIAGNOSIS — E039 Hypothyroidism, unspecified: Secondary | ICD-10-CM | POA: Diagnosis not present

## 2021-05-13 DIAGNOSIS — E782 Mixed hyperlipidemia: Secondary | ICD-10-CM | POA: Diagnosis not present

## 2021-05-13 DIAGNOSIS — F325 Major depressive disorder, single episode, in full remission: Secondary | ICD-10-CM | POA: Diagnosis not present

## 2021-05-13 DIAGNOSIS — I10 Essential (primary) hypertension: Secondary | ICD-10-CM | POA: Diagnosis not present

## 2021-05-24 DIAGNOSIS — E1169 Type 2 diabetes mellitus with other specified complication: Secondary | ICD-10-CM | POA: Diagnosis not present

## 2021-05-25 DIAGNOSIS — Z20822 Contact with and (suspected) exposure to covid-19: Secondary | ICD-10-CM | POA: Diagnosis not present

## 2021-06-14 DIAGNOSIS — R319 Hematuria, unspecified: Secondary | ICD-10-CM | POA: Diagnosis not present

## 2021-06-14 DIAGNOSIS — R110 Nausea: Secondary | ICD-10-CM | POA: Diagnosis not present

## 2021-06-14 DIAGNOSIS — N390 Urinary tract infection, site not specified: Secondary | ICD-10-CM | POA: Diagnosis not present

## 2021-06-23 DIAGNOSIS — E1169 Type 2 diabetes mellitus with other specified complication: Secondary | ICD-10-CM | POA: Diagnosis not present

## 2021-07-09 DIAGNOSIS — Z7984 Long term (current) use of oral hypoglycemic drugs: Secondary | ICD-10-CM | POA: Diagnosis not present

## 2021-07-09 DIAGNOSIS — K219 Gastro-esophageal reflux disease without esophagitis: Secondary | ICD-10-CM | POA: Diagnosis not present

## 2021-07-09 DIAGNOSIS — H35373 Puckering of macula, bilateral: Secondary | ICD-10-CM | POA: Diagnosis not present

## 2021-07-09 DIAGNOSIS — H26493 Other secondary cataract, bilateral: Secondary | ICD-10-CM | POA: Diagnosis not present

## 2021-07-09 DIAGNOSIS — I10 Essential (primary) hypertension: Secondary | ICD-10-CM | POA: Diagnosis not present

## 2021-07-09 DIAGNOSIS — E1169 Type 2 diabetes mellitus with other specified complication: Secondary | ICD-10-CM | POA: Diagnosis not present

## 2021-07-09 DIAGNOSIS — E119 Type 2 diabetes mellitus without complications: Secondary | ICD-10-CM | POA: Diagnosis not present

## 2021-07-09 DIAGNOSIS — E039 Hypothyroidism, unspecified: Secondary | ICD-10-CM | POA: Diagnosis not present

## 2021-07-09 DIAGNOSIS — F325 Major depressive disorder, single episode, in full remission: Secondary | ICD-10-CM | POA: Diagnosis not present

## 2021-07-09 DIAGNOSIS — E782 Mixed hyperlipidemia: Secondary | ICD-10-CM | POA: Diagnosis not present

## 2021-07-09 DIAGNOSIS — D500 Iron deficiency anemia secondary to blood loss (chronic): Secondary | ICD-10-CM | POA: Diagnosis not present

## 2021-07-15 DIAGNOSIS — R3121 Asymptomatic microscopic hematuria: Secondary | ICD-10-CM | POA: Diagnosis not present

## 2021-07-15 DIAGNOSIS — E1169 Type 2 diabetes mellitus with other specified complication: Secondary | ICD-10-CM | POA: Diagnosis not present

## 2021-07-15 DIAGNOSIS — I10 Essential (primary) hypertension: Secondary | ICD-10-CM | POA: Diagnosis not present

## 2021-07-23 DIAGNOSIS — R0981 Nasal congestion: Secondary | ICD-10-CM | POA: Diagnosis not present

## 2021-07-23 DIAGNOSIS — E1169 Type 2 diabetes mellitus with other specified complication: Secondary | ICD-10-CM | POA: Diagnosis not present

## 2021-07-23 DIAGNOSIS — Z03818 Encounter for observation for suspected exposure to other biological agents ruled out: Secondary | ICD-10-CM | POA: Diagnosis not present

## 2021-07-23 DIAGNOSIS — R5383 Other fatigue: Secondary | ICD-10-CM | POA: Diagnosis not present

## 2021-07-23 DIAGNOSIS — J020 Streptococcal pharyngitis: Secondary | ICD-10-CM | POA: Diagnosis not present

## 2021-07-23 DIAGNOSIS — J029 Acute pharyngitis, unspecified: Secondary | ICD-10-CM | POA: Diagnosis not present

## 2021-08-23 DIAGNOSIS — E1169 Type 2 diabetes mellitus with other specified complication: Secondary | ICD-10-CM | POA: Diagnosis not present

## 2021-08-23 DIAGNOSIS — K219 Gastro-esophageal reflux disease without esophagitis: Secondary | ICD-10-CM | POA: Diagnosis not present

## 2021-08-23 DIAGNOSIS — E782 Mixed hyperlipidemia: Secondary | ICD-10-CM | POA: Diagnosis not present

## 2021-08-23 DIAGNOSIS — I10 Essential (primary) hypertension: Secondary | ICD-10-CM | POA: Diagnosis not present

## 2021-08-23 DIAGNOSIS — E039 Hypothyroidism, unspecified: Secondary | ICD-10-CM | POA: Diagnosis not present

## 2021-08-23 DIAGNOSIS — F325 Major depressive disorder, single episode, in full remission: Secondary | ICD-10-CM | POA: Diagnosis not present

## 2021-08-24 DIAGNOSIS — E1169 Type 2 diabetes mellitus with other specified complication: Secondary | ICD-10-CM | POA: Diagnosis not present

## 2021-09-15 DIAGNOSIS — I10 Essential (primary) hypertension: Secondary | ICD-10-CM | POA: Diagnosis not present

## 2021-09-15 DIAGNOSIS — E782 Mixed hyperlipidemia: Secondary | ICD-10-CM | POA: Diagnosis not present

## 2021-09-15 DIAGNOSIS — E1169 Type 2 diabetes mellitus with other specified complication: Secondary | ICD-10-CM | POA: Diagnosis not present

## 2021-09-17 DIAGNOSIS — H26493 Other secondary cataract, bilateral: Secondary | ICD-10-CM | POA: Diagnosis not present

## 2021-09-17 DIAGNOSIS — H26491 Other secondary cataract, right eye: Secondary | ICD-10-CM | POA: Diagnosis not present

## 2021-09-17 DIAGNOSIS — H26492 Other secondary cataract, left eye: Secondary | ICD-10-CM | POA: Diagnosis not present

## 2021-09-21 DIAGNOSIS — I10 Essential (primary) hypertension: Secondary | ICD-10-CM | POA: Diagnosis not present

## 2021-10-07 DIAGNOSIS — E782 Mixed hyperlipidemia: Secondary | ICD-10-CM | POA: Diagnosis not present

## 2021-10-07 DIAGNOSIS — I10 Essential (primary) hypertension: Secondary | ICD-10-CM | POA: Diagnosis not present

## 2021-10-07 DIAGNOSIS — F325 Major depressive disorder, single episode, in full remission: Secondary | ICD-10-CM | POA: Diagnosis not present

## 2021-10-07 DIAGNOSIS — E1169 Type 2 diabetes mellitus with other specified complication: Secondary | ICD-10-CM | POA: Diagnosis not present

## 2021-10-07 DIAGNOSIS — E039 Hypothyroidism, unspecified: Secondary | ICD-10-CM | POA: Diagnosis not present

## 2021-10-22 DIAGNOSIS — E1169 Type 2 diabetes mellitus with other specified complication: Secondary | ICD-10-CM | POA: Diagnosis not present

## 2021-11-18 DIAGNOSIS — D500 Iron deficiency anemia secondary to blood loss (chronic): Secondary | ICD-10-CM | POA: Diagnosis not present

## 2021-11-18 DIAGNOSIS — M542 Cervicalgia: Secondary | ICD-10-CM | POA: Diagnosis not present

## 2021-11-18 DIAGNOSIS — G4733 Obstructive sleep apnea (adult) (pediatric): Secondary | ICD-10-CM | POA: Diagnosis not present

## 2021-11-18 DIAGNOSIS — Z0000 Encounter for general adult medical examination without abnormal findings: Secondary | ICD-10-CM | POA: Diagnosis not present

## 2021-11-18 DIAGNOSIS — I10 Essential (primary) hypertension: Secondary | ICD-10-CM | POA: Diagnosis not present

## 2021-11-18 DIAGNOSIS — Z794 Long term (current) use of insulin: Secondary | ICD-10-CM | POA: Diagnosis not present

## 2021-11-18 DIAGNOSIS — F325 Major depressive disorder, single episode, in full remission: Secondary | ICD-10-CM | POA: Diagnosis not present

## 2021-11-18 DIAGNOSIS — E1169 Type 2 diabetes mellitus with other specified complication: Secondary | ICD-10-CM | POA: Diagnosis not present

## 2021-11-18 DIAGNOSIS — K257 Chronic gastric ulcer without hemorrhage or perforation: Secondary | ICD-10-CM | POA: Diagnosis not present

## 2021-11-18 DIAGNOSIS — E538 Deficiency of other specified B group vitamins: Secondary | ICD-10-CM | POA: Diagnosis not present

## 2021-11-18 DIAGNOSIS — E782 Mixed hyperlipidemia: Secondary | ICD-10-CM | POA: Diagnosis not present

## 2021-11-18 DIAGNOSIS — E039 Hypothyroidism, unspecified: Secondary | ICD-10-CM | POA: Diagnosis not present

## 2021-11-21 DIAGNOSIS — E1169 Type 2 diabetes mellitus with other specified complication: Secondary | ICD-10-CM | POA: Diagnosis not present

## 2021-12-02 DIAGNOSIS — N3000 Acute cystitis without hematuria: Secondary | ICD-10-CM | POA: Diagnosis not present

## 2021-12-16 ENCOUNTER — Other Ambulatory Visit: Payer: Self-pay | Admitting: Internal Medicine

## 2021-12-16 DIAGNOSIS — Z1231 Encounter for screening mammogram for malignant neoplasm of breast: Secondary | ICD-10-CM

## 2021-12-22 DIAGNOSIS — E1169 Type 2 diabetes mellitus with other specified complication: Secondary | ICD-10-CM | POA: Diagnosis not present

## 2021-12-23 DIAGNOSIS — E1169 Type 2 diabetes mellitus with other specified complication: Secondary | ICD-10-CM | POA: Diagnosis not present

## 2021-12-23 DIAGNOSIS — E039 Hypothyroidism, unspecified: Secondary | ICD-10-CM | POA: Diagnosis not present

## 2021-12-23 DIAGNOSIS — E782 Mixed hyperlipidemia: Secondary | ICD-10-CM | POA: Diagnosis not present

## 2021-12-23 DIAGNOSIS — K219 Gastro-esophageal reflux disease without esophagitis: Secondary | ICD-10-CM | POA: Diagnosis not present

## 2021-12-23 DIAGNOSIS — I10 Essential (primary) hypertension: Secondary | ICD-10-CM | POA: Diagnosis not present

## 2022-01-06 ENCOUNTER — Ambulatory Visit
Admission: RE | Admit: 2022-01-06 | Discharge: 2022-01-06 | Disposition: A | Discharge status: Home / Self Care | Payer: Medicare Other | Source: Ambulatory Visit | Attending: Internal Medicine | Admitting: Internal Medicine

## 2022-01-06 DIAGNOSIS — Z1231 Encounter for screening mammogram for malignant neoplasm of breast: Secondary | ICD-10-CM | POA: Diagnosis not present

## 2022-01-21 DIAGNOSIS — E1169 Type 2 diabetes mellitus with other specified complication: Secondary | ICD-10-CM | POA: Diagnosis not present

## 2022-03-10 DIAGNOSIS — E1169 Type 2 diabetes mellitus with other specified complication: Secondary | ICD-10-CM | POA: Diagnosis not present

## 2022-03-10 DIAGNOSIS — E039 Hypothyroidism, unspecified: Secondary | ICD-10-CM | POA: Diagnosis not present

## 2022-03-10 DIAGNOSIS — F325 Major depressive disorder, single episode, in full remission: Secondary | ICD-10-CM | POA: Diagnosis not present

## 2022-03-10 DIAGNOSIS — K219 Gastro-esophageal reflux disease without esophagitis: Secondary | ICD-10-CM | POA: Diagnosis not present

## 2022-03-10 DIAGNOSIS — E782 Mixed hyperlipidemia: Secondary | ICD-10-CM | POA: Diagnosis not present

## 2022-03-10 DIAGNOSIS — I10 Essential (primary) hypertension: Secondary | ICD-10-CM | POA: Diagnosis not present

## 2022-04-14 DIAGNOSIS — R200 Anesthesia of skin: Secondary | ICD-10-CM | POA: Diagnosis not present

## 2022-04-14 DIAGNOSIS — M2550 Pain in unspecified joint: Secondary | ICD-10-CM | POA: Diagnosis not present

## 2022-04-14 DIAGNOSIS — M25559 Pain in unspecified hip: Secondary | ICD-10-CM | POA: Diagnosis not present

## 2022-04-14 DIAGNOSIS — I10 Essential (primary) hypertension: Secondary | ICD-10-CM | POA: Diagnosis not present

## 2022-04-14 DIAGNOSIS — E1169 Type 2 diabetes mellitus with other specified complication: Secondary | ICD-10-CM | POA: Diagnosis not present

## 2022-04-14 DIAGNOSIS — M25569 Pain in unspecified knee: Secondary | ICD-10-CM | POA: Diagnosis not present

## 2022-04-14 DIAGNOSIS — M25519 Pain in unspecified shoulder: Secondary | ICD-10-CM | POA: Diagnosis not present

## 2022-05-05 DIAGNOSIS — Z23 Encounter for immunization: Secondary | ICD-10-CM | POA: Diagnosis not present

## 2022-05-12 DIAGNOSIS — G5603 Carpal tunnel syndrome, bilateral upper limbs: Secondary | ICD-10-CM | POA: Diagnosis not present

## 2022-05-21 DIAGNOSIS — N3001 Acute cystitis with hematuria: Secondary | ICD-10-CM | POA: Diagnosis not present

## 2022-05-21 DIAGNOSIS — N10 Acute tubulo-interstitial nephritis: Secondary | ICD-10-CM | POA: Diagnosis not present

## 2022-05-24 DIAGNOSIS — E039 Hypothyroidism, unspecified: Secondary | ICD-10-CM | POA: Diagnosis not present

## 2022-05-24 DIAGNOSIS — N12 Tubulo-interstitial nephritis, not specified as acute or chronic: Secondary | ICD-10-CM | POA: Diagnosis not present

## 2022-06-07 DIAGNOSIS — E039 Hypothyroidism, unspecified: Secondary | ICD-10-CM | POA: Diagnosis not present

## 2022-06-07 DIAGNOSIS — E782 Mixed hyperlipidemia: Secondary | ICD-10-CM | POA: Diagnosis not present

## 2022-06-07 DIAGNOSIS — I10 Essential (primary) hypertension: Secondary | ICD-10-CM | POA: Diagnosis not present

## 2022-06-07 DIAGNOSIS — E1169 Type 2 diabetes mellitus with other specified complication: Secondary | ICD-10-CM | POA: Diagnosis not present

## 2022-06-07 DIAGNOSIS — K219 Gastro-esophageal reflux disease without esophagitis: Secondary | ICD-10-CM | POA: Diagnosis not present

## 2022-06-07 DIAGNOSIS — F325 Major depressive disorder, single episode, in full remission: Secondary | ICD-10-CM | POA: Diagnosis not present

## 2022-06-08 DIAGNOSIS — G5603 Carpal tunnel syndrome, bilateral upper limbs: Secondary | ICD-10-CM | POA: Diagnosis not present

## 2022-06-08 DIAGNOSIS — G5602 Carpal tunnel syndrome, left upper limb: Secondary | ICD-10-CM | POA: Diagnosis not present

## 2022-06-08 DIAGNOSIS — G5601 Carpal tunnel syndrome, right upper limb: Secondary | ICD-10-CM | POA: Diagnosis not present

## 2022-06-09 DIAGNOSIS — N10 Acute tubulo-interstitial nephritis: Secondary | ICD-10-CM | POA: Diagnosis not present

## 2022-06-22 DIAGNOSIS — G5602 Carpal tunnel syndrome, left upper limb: Secondary | ICD-10-CM | POA: Diagnosis not present

## 2022-07-21 DIAGNOSIS — Z7984 Long term (current) use of oral hypoglycemic drugs: Secondary | ICD-10-CM | POA: Diagnosis not present

## 2022-07-21 DIAGNOSIS — H35373 Puckering of macula, bilateral: Secondary | ICD-10-CM | POA: Diagnosis not present

## 2022-07-21 DIAGNOSIS — E119 Type 2 diabetes mellitus without complications: Secondary | ICD-10-CM | POA: Diagnosis not present

## 2022-08-17 DIAGNOSIS — G5601 Carpal tunnel syndrome, right upper limb: Secondary | ICD-10-CM | POA: Diagnosis not present

## 2022-08-18 DIAGNOSIS — R300 Dysuria: Secondary | ICD-10-CM | POA: Diagnosis not present

## 2022-08-18 DIAGNOSIS — R1013 Epigastric pain: Secondary | ICD-10-CM | POA: Diagnosis not present

## 2022-08-18 DIAGNOSIS — E782 Mixed hyperlipidemia: Secondary | ICD-10-CM | POA: Diagnosis not present

## 2022-08-18 DIAGNOSIS — E1169 Type 2 diabetes mellitus with other specified complication: Secondary | ICD-10-CM | POA: Diagnosis not present

## 2022-08-18 DIAGNOSIS — D649 Anemia, unspecified: Secondary | ICD-10-CM | POA: Diagnosis not present

## 2022-08-18 DIAGNOSIS — E039 Hypothyroidism, unspecified: Secondary | ICD-10-CM | POA: Diagnosis not present

## 2022-08-18 DIAGNOSIS — I10 Essential (primary) hypertension: Secondary | ICD-10-CM | POA: Diagnosis not present

## 2022-08-26 DIAGNOSIS — Z1211 Encounter for screening for malignant neoplasm of colon: Secondary | ICD-10-CM | POA: Diagnosis not present

## 2022-09-13 DIAGNOSIS — D509 Iron deficiency anemia, unspecified: Secondary | ICD-10-CM | POA: Diagnosis not present

## 2022-09-13 DIAGNOSIS — G5601 Carpal tunnel syndrome, right upper limb: Secondary | ICD-10-CM | POA: Diagnosis not present

## 2022-09-13 DIAGNOSIS — R1013 Epigastric pain: Secondary | ICD-10-CM | POA: Diagnosis not present

## 2022-09-14 DIAGNOSIS — K2930 Chronic superficial gastritis without bleeding: Secondary | ICD-10-CM | POA: Diagnosis not present

## 2022-09-14 DIAGNOSIS — R1013 Epigastric pain: Secondary | ICD-10-CM | POA: Diagnosis not present

## 2022-09-14 DIAGNOSIS — K449 Diaphragmatic hernia without obstruction or gangrene: Secondary | ICD-10-CM | POA: Diagnosis not present

## 2022-09-14 DIAGNOSIS — D509 Iron deficiency anemia, unspecified: Secondary | ICD-10-CM | POA: Diagnosis not present

## 2022-09-22 DIAGNOSIS — K2930 Chronic superficial gastritis without bleeding: Secondary | ICD-10-CM | POA: Diagnosis not present

## 2022-09-27 DIAGNOSIS — M1712 Unilateral primary osteoarthritis, left knee: Secondary | ICD-10-CM | POA: Diagnosis not present

## 2022-09-27 DIAGNOSIS — M25562 Pain in left knee: Secondary | ICD-10-CM | POA: Diagnosis not present

## 2022-11-10 DIAGNOSIS — D509 Iron deficiency anemia, unspecified: Secondary | ICD-10-CM | POA: Diagnosis not present

## 2022-11-10 DIAGNOSIS — K3189 Other diseases of stomach and duodenum: Secondary | ICD-10-CM | POA: Diagnosis not present

## 2022-11-10 DIAGNOSIS — K3184 Gastroparesis: Secondary | ICD-10-CM | POA: Diagnosis not present

## 2022-12-11 DIAGNOSIS — N390 Urinary tract infection, site not specified: Secondary | ICD-10-CM | POA: Diagnosis not present

## 2022-12-16 DIAGNOSIS — D509 Iron deficiency anemia, unspecified: Secondary | ICD-10-CM | POA: Diagnosis not present

## 2022-12-16 DIAGNOSIS — K449 Diaphragmatic hernia without obstruction or gangrene: Secondary | ICD-10-CM | POA: Diagnosis not present

## 2022-12-16 DIAGNOSIS — K648 Other hemorrhoids: Secondary | ICD-10-CM | POA: Diagnosis not present

## 2022-12-16 DIAGNOSIS — K3189 Other diseases of stomach and duodenum: Secondary | ICD-10-CM | POA: Diagnosis not present

## 2022-12-16 DIAGNOSIS — K644 Residual hemorrhoidal skin tags: Secondary | ICD-10-CM | POA: Diagnosis not present

## 2022-12-16 DIAGNOSIS — K2930 Chronic superficial gastritis without bleeding: Secondary | ICD-10-CM | POA: Diagnosis not present

## 2022-12-16 DIAGNOSIS — D122 Benign neoplasm of ascending colon: Secondary | ICD-10-CM | POA: Diagnosis not present

## 2022-12-21 DIAGNOSIS — D122 Benign neoplasm of ascending colon: Secondary | ICD-10-CM | POA: Diagnosis not present

## 2022-12-21 DIAGNOSIS — K2930 Chronic superficial gastritis without bleeding: Secondary | ICD-10-CM | POA: Diagnosis not present

## 2022-12-22 DIAGNOSIS — G5601 Carpal tunnel syndrome, right upper limb: Secondary | ICD-10-CM | POA: Diagnosis not present

## 2022-12-22 DIAGNOSIS — G5602 Carpal tunnel syndrome, left upper limb: Secondary | ICD-10-CM | POA: Diagnosis not present

## 2022-12-29 DIAGNOSIS — N3000 Acute cystitis without hematuria: Secondary | ICD-10-CM | POA: Diagnosis not present

## 2022-12-29 DIAGNOSIS — R399 Unspecified symptoms and signs involving the genitourinary system: Secondary | ICD-10-CM | POA: Diagnosis not present

## 2023-01-05 ENCOUNTER — Other Ambulatory Visit: Payer: Self-pay | Admitting: Internal Medicine

## 2023-01-05 DIAGNOSIS — Z1231 Encounter for screening mammogram for malignant neoplasm of breast: Secondary | ICD-10-CM

## 2023-01-20 ENCOUNTER — Ambulatory Visit
Admission: RE | Admit: 2023-01-20 | Discharge: 2023-01-20 | Disposition: A | Discharge status: Home / Self Care | Payer: Medicare Other | Source: Ambulatory Visit | Attending: Internal Medicine | Admitting: Internal Medicine

## 2023-01-20 DIAGNOSIS — Z1231 Encounter for screening mammogram for malignant neoplasm of breast: Secondary | ICD-10-CM

## 2023-02-09 DIAGNOSIS — E039 Hypothyroidism, unspecified: Secondary | ICD-10-CM | POA: Diagnosis not present

## 2023-02-09 DIAGNOSIS — Z23 Encounter for immunization: Secondary | ICD-10-CM | POA: Diagnosis not present

## 2023-02-09 DIAGNOSIS — N1831 Chronic kidney disease, stage 3a: Secondary | ICD-10-CM | POA: Diagnosis not present

## 2023-02-09 DIAGNOSIS — Z0000 Encounter for general adult medical examination without abnormal findings: Secondary | ICD-10-CM | POA: Diagnosis not present

## 2023-02-09 DIAGNOSIS — R300 Dysuria: Secondary | ICD-10-CM | POA: Diagnosis not present

## 2023-02-09 DIAGNOSIS — D649 Anemia, unspecified: Secondary | ICD-10-CM | POA: Diagnosis not present

## 2023-02-09 DIAGNOSIS — I10 Essential (primary) hypertension: Secondary | ICD-10-CM | POA: Diagnosis not present

## 2023-02-09 DIAGNOSIS — E1169 Type 2 diabetes mellitus with other specified complication: Secondary | ICD-10-CM | POA: Diagnosis not present

## 2023-02-09 DIAGNOSIS — E782 Mixed hyperlipidemia: Secondary | ICD-10-CM | POA: Diagnosis not present

## 2023-02-09 DIAGNOSIS — R1013 Epigastric pain: Secondary | ICD-10-CM | POA: Diagnosis not present

## 2023-02-09 DIAGNOSIS — Z1331 Encounter for screening for depression: Secondary | ICD-10-CM | POA: Diagnosis not present

## 2023-03-03 DIAGNOSIS — N3001 Acute cystitis with hematuria: Secondary | ICD-10-CM | POA: Diagnosis not present

## 2023-04-26 ENCOUNTER — Encounter: Payer: Self-pay | Admitting: Urology

## 2023-04-26 ENCOUNTER — Ambulatory Visit (INDEPENDENT_AMBULATORY_CARE_PROVIDER_SITE_OTHER): Payer: Medicare Other | Admitting: Urology

## 2023-04-26 VITALS — BP 132/72 | HR 83

## 2023-04-26 DIAGNOSIS — N390 Urinary tract infection, site not specified: Secondary | ICD-10-CM

## 2023-04-26 DIAGNOSIS — R82998 Other abnormal findings in urine: Secondary | ICD-10-CM | POA: Diagnosis not present

## 2023-04-26 DIAGNOSIS — Z87440 Personal history of urinary (tract) infections: Secondary | ICD-10-CM | POA: Diagnosis not present

## 2023-04-26 LAB — BLADDER SCAN AMB NON-IMAGING: Scan Result: 29

## 2023-04-26 MED ORDER — ESTRADIOL 0.1 MG/GM VA CREA: TOPICAL_CREAM | VAGINAL | 12 refills | Status: AC

## 2023-04-26 NOTE — Progress Notes (Signed)
Sharon Goodman,acting as a scribe for Sharon Scotland, MD.,have documented all relevant documentation on the behalf of Sharon Scotland, MD,as directed by  Sharon Scotland, MD while in the presence of Sharon Scotland, MD.  04/26/2023 3:55 PM   Comer Goodman Sharon Goodman 10-11-1945 [id number]  Referring provider: No referring provider defined for this encounter.  Chief Complaint  Patient presents with   Establish Care   Recurrent UTI    HPI:  77 year-old female who presents today for further evaluation of possible recurrent urinary tract infections.   Her urinalysis today shows 11-30 WBC per high powered field, >10 epithelial cells. Nitrate positive, many bacteria.   We have very limited records available. She was seen on 03/03/2023 complaining of several months of intermittent dysuria. Her dip showed a small amount of leukocytes and trace blood. No microscopic was done. A urine culture grew multiple organisms. She was diagnosed with acute cystitis and treated with Augmentin. She was seen by Dr. Sherrill Raring. We have records from [facility] from back in 2021, at which time she was seen for the same issue. At that time, she was having issues with recurrent E. Coli urinary tract infections and a self-reported frequency at about 3 times a month. She was started on topical estrogen cream and given 6 months of suppressive Macrobid. She was only seen on 1 occasion.   Her most recent cross-sectional imaging was in the form of a CT scan in 2018 that showed normal kidneys and bladder, no masses, or kidney stones.   Today, she  reports experiencing urgency after urination, weakness, nausea, and back pain, but not frequent urination.   Results for orders placed or performed in visit on 04/26/23  Bladder Scan (Post Void Residual) in office  Result Value Ref Range   Scan Result 29 ml       PMH: Past Medical History:  Diagnosis Date   Diabetes mellitus    GERD (gastroesophageal reflux disease)     takes prilosec   Hyperlipidemia    Hypertension    pt. doe not have  stress test at dr. Michaelle Copas or Dr. Gardiner Sleeper office   Hypothyroidism    PONV (postoperative nausea and vomiting)     Surgical History: Past Surgical History:  Procedure Laterality Date   CHOLECYSTECTOMY     LUMBAR LAMINECTOMY/DECOMPRESSION MICRODISCECTOMY  06/02/2011   Procedure: LUMBAR LAMINECTOMY/DECOMPRESSION MICRODISCECTOMY;  Surgeon: Clydene Fake;  Location: [facility];  Service: Neurosurgery;  Laterality: Left;  Left L3-4 Laminectomy   NASAL SEPTUM SURGERY      Home Medications:  Allergies as of 04/26/2023       Reactions   Lipitor [atorvastatin]    Body aches    Ramipril Cough        Medication List        Accurate as of April 26, 2023  3:55 PM. If you have any questions, ask your nurse or doctor.          STOP taking these medications    atenolol 25 MG tablet Commonly known as: TENORMIN Stopped by: Sharon Goodman   insulin glargine 100 UNIT/ML injection Commonly known as: LANTUS Stopped by: Sharon Goodman   levothyroxine 112 MCG tablet Commonly known as: SYNTHROID Stopped by: Sharon Goodman   losartan 100 MG tablet Commonly known as: COZAAR Stopped by: Sharon Goodman   ondansetron 4 MG disintegrating tablet Commonly known as: Zofran ODT Stopped by: Sharon Goodman   polyethylene glycol-electrolytes 420 g solution Commonly known as: NuLYTELY Stopped  by: Sharon Goodman   rosuvastatin 5 MG tablet Commonly known as: CRESTOR Stopped by: Sharon Goodman   Victoza 18 MG/3ML Sopn Generic drug: liraglutide Stopped by: Sharon Goodman       TAKE these medications    amLODipine 10 MG tablet Commonly known as: NORVASC TAKE 1 TABLET BY MOUTH EVERY DAY FOR 30 DAYS for 90 days   erythromycin 250 MG EC tablet Commonly known as: ERY-TAB Take 250 mg by mouth 3 (three) times daily.   estradiol 0.1 MG/GM vaginal cream Commonly known as: ESTRACE Estrogen Cream Instruction  Discard applicator Apply pea sized amount to tip of finger to urethra before bed. Wash hands well after application. Use every day for 2 weeks and then use Monday, Wednesday and Friday only thereafter Started by: Sharon Goodman   LORazepam 0.5 MG tablet Commonly known as: ATIVAN Take 0.5 mg by mouth daily as needed. For anxiety   metFORMIN 1000 MG tablet Commonly known as: GLUCOPHAGE Take 1,000 mg by mouth 2 (two) times daily.   omeprazole 40 MG capsule Commonly known as: PRILOSEC TAKE 1 CAPSULE BY MOUTH 30 MINUTES BEFORE MORNING MEAL EVERY DAY FOR 30 DAYS for 90   PARoxetine 40 MG tablet Commonly known as: PAXIL Take 40 mg by mouth daily.   pioglitazone 45 MG tablet Commonly known as: ACTOS Take 45 mg by mouth daily.   pravastatin 40 MG tablet Commonly known as: PRAVACHOL TAKE 1 TABLET BY MOUTH EVERY DAY for 90 days   telmisartan 80 MG tablet Commonly known as: MICARDIS Take 80 mg by mouth daily.        Allergies:  Allergies  Allergen Reactions   Lipitor [Atorvastatin]     Body aches    Ramipril Cough     Social History:  reports that she has never smoked. She has never used smokeless tobacco. She reports current alcohol use. She reports that she does not use drugs.   Physical Exam: BP 132/72   Pulse 83   Constitutional:  Alert and oriented, No acute distress. HEENT: [place] AT, moist mucus membranes.  Trachea midline, no masses. Neurologic: Grossly intact, no focal deficits, moving all 4 extremities. Psychiatric: Normal mood and affect.  Results for orders placed or performed in visit on 04/26/23  Microscopic Examination   Urine  Result Value Ref Range   WBC, UA 11-30 (A) 0 - 5 /hpf   RBC, Urine 0-2 0 - 2 /hpf   Epithelial Cells (non renal) >10 (A) 0 - 10 /hpf   Bacteria, UA Many (A) None seen/Few  Urinalysis, Complete  Result Value Ref Range   Specific Gravity, UA 1.015 1.005 - 1.030   pH, UA 5.5 5.0 - 7.5   Color, UA Yellow Yellow   Appearance Ur  Hazy (A) Clear   Leukocytes,UA Trace (A) Negative   Protein,UA Negative Negative/Trace   Glucose, UA Negative Negative   Ketones, UA Negative Negative   RBC, UA Negative Negative   Bilirubin, UA Negative Negative   Urobilinogen, Ur 0.2 0.2 - 1.0 mg/dL   Nitrite, UA Positive (A) Negative   Microscopic Examination See below:   Bladder Scan (Post Void Residual) in office  Result Value Ref Range   Scan Result 29 ml      Assessment & Plan:    1. Recurrent UTI - Her urinalysis today is contaminated.  - We will send a culture to see if she grows anything clonal.  - Recommend catheterization for future urine samples to ensure accurate diagnosis -  She has been treated at urgent care facilities, receiving multiple antibiotics without resolution. Suspect over-treatment and contamination of urine samples  - Recurrent UTI Prevention Strategies  Stay well hydrated. Get a moderate amount of exercise. Eat a diet rich in fruit and vegetables. Start a bowel regimen to manage your constipation. Your goal is to have consistent, formed bowel movements that are easy for you to pass. You may use either of the over-the-counter supplements Benefiber or Miralax to help with this. I recommend that you try Benefiber first and move on to Miralax if this is not helping you enough. You may adjust the recommended dose of Miralax (one capful daily) to achieve this goal. Start taking an over-the-counter cranberry supplement for urinary tract health. Take this once or twice daily on an empty stomach, e.g. right before bed. Start taking an over-the-counter d-mannose supplement. Take this daily per packaging instructions. Start taking an over-the-counter probiotic containing the bacterial species called Lactobacillus. Take this daily. Start vaginal estrogen cream. Apply a pea-sized amount around the opening of the urethra every day for 2 weeks, then three times weekly forever. Advised on proper application.   Return  if symptoms worsen or fail to improve.   [facility] [address] [place], [place] [address] [phone]

## 2023-04-26 NOTE — Patient Instructions (Signed)
For UTI prevention take cranberry tablets, probiotic, D-Mannose daily.  

## 2023-04-27 LAB — MICROSCOPIC EXAMINATION: Epithelial Cells (non renal): >10 /[HPF] — AB (ref 0–10)

## 2023-04-27 LAB — URINALYSIS, COMPLETE
Bilirubin, UA: NEGATIVE
Glucose, UA: NEGATIVE
Ketones, UA: NEGATIVE
Nitrite, UA: POSITIVE — AB
Protein,UA: NEGATIVE
RBC, UA: NEGATIVE
Specific Gravity, UA: 1.015 (ref 1.005–1.030)
Urobilinogen, Ur: 0.2 mg/dL (ref 0.2–1.0)
pH, UA: 5.5 (ref 5.0–7.5)

## 2023-04-29 LAB — CULTURE, URINE COMPREHENSIVE

## 2023-05-02 ENCOUNTER — Telehealth: Payer: Self-pay | Admitting: Urology

## 2023-05-02 MED ORDER — NITROFURANTOIN MONOHYD MACRO 100 MG PO CAPS: 100.0000 mg | ORAL_CAPSULE | Freq: Two times a day (BID) | ORAL | 0 refills | Status: DC

## 2023-05-02 NOTE — Telephone Encounter (Signed)
Vanna Scotland, MD  Domingo Cocking V, CMA Urine did grow E. coli.  Please treat with Macrobid 100 mg twice daily x 10 days.  Vanna Scotland, MD  Medication sent in, patient advised.

## 2023-05-02 NOTE — Telephone Encounter (Signed)
Pt returned your call, she needs Macrobid sent to [facility] in [place].

## 2023-05-11 DIAGNOSIS — Z23 Encounter for immunization: Secondary | ICD-10-CM | POA: Diagnosis not present

## 2023-07-28 ENCOUNTER — Ambulatory Visit (INDEPENDENT_AMBULATORY_CARE_PROVIDER_SITE_OTHER): Payer: Medicare Other | Admitting: Physician Assistant

## 2023-07-28 VITALS — BP 143/84 | HR 66 | Ht 64.0 in | Wt 208.1 lb

## 2023-07-28 DIAGNOSIS — R300 Dysuria: Secondary | ICD-10-CM | POA: Diagnosis not present

## 2023-07-28 LAB — URINALYSIS, COMPLETE
Bilirubin, UA: NEGATIVE
Glucose, UA: NEGATIVE
Ketones, UA: NEGATIVE
Nitrite, UA: POSITIVE — AB
Protein,UA: NEGATIVE
RBC, UA: NEGATIVE
Specific Gravity, UA: 1.01 (ref 1.005–1.030)
Urobilinogen, Ur: 0.2 mg/dL (ref 0.2–1.0)
pH, UA: 6.5 (ref 5.0–7.5)

## 2023-07-28 LAB — MICROSCOPIC EXAMINATION

## 2023-07-28 MED ORDER — CEFUROXIME AXETIL 250 MG PO TABS: 250.0000 mg | ORAL_TABLET | Freq: Two times a day (BID) | ORAL | 0 refills | Status: AC

## 2023-07-28 NOTE — Progress Notes (Signed)
 In and Out Catheterization  Patient is present today for a I & O catheterization due to dysuria. Patient was cleaned and prepped in a sterile fashion with betadine . A 14 FR cath was inserted no complications were noted , 100 ml of urine return was noted, urine was yellow in color. A clean urine sample was collected for UA. Bladder was drained  And catheter was removed with out difficulty.    Performed by: Eastyn Skalla H RMA  Follow up/ Additional notes: will call with results

## 2023-07-28 NOTE — Progress Notes (Signed)
 07/28/2023 1:37 PM   Sharon Goodman 1945-11-07 [id number]  CC: Chief Complaint  Patient presents with   Dysuria   HPI: Sharon Goodman is a 78 y.o. female with PMH possible recurrent UTI on topical vaginal estrogen cream, d-mannose, and cranberry supplements who presents today for evaluation of possible UTI.   Today she reports chronic dysuria that tends to wax and wane, however became particularly bothersome and was accompanied by malaise 2 to 3 days ago.  She thinks overall she has been doing much better since starting estrogen cream, d-mannose, and cranberry supplements per Dr. Penne about 3 months ago.  She reports her nocturia has improved from every 2 hours to now 1-2 times nightly.  In-office catheterized UA today positive for nitrites and trace leukocytes; urine microscopy with 11-30 WBCs/HPF and many bacteria.   PMH: Past Medical History:  Diagnosis Date   Diabetes mellitus    GERD (gastroesophageal reflux disease)    takes prilosec   Hyperlipidemia    Hypertension    pt. doe not have  stress test at dr. Theressa or Dr. maryrose office   Hypothyroidism    PONV (postoperative nausea and vomiting)     Surgical History: Past Surgical History:  Procedure Laterality Date   CHOLECYSTECTOMY     LUMBAR LAMINECTOMY/DECOMPRESSION MICRODISCECTOMY  06/02/2011   Procedure: LUMBAR LAMINECTOMY/DECOMPRESSION MICRODISCECTOMY;  Surgeon: Lynwood JONELLE Mill;  Location: [facility];  Service: Neurosurgery;  Laterality: Left;  Left L3-4 Laminectomy   NASAL SEPTUM SURGERY      Home Medications:  Allergies as of 07/28/2023       Reactions   Lipitor [atorvastatin]    Body aches    Ramipril Cough        Medication List        Accurate as of July 28, 2023  1:37 PM. If you have any questions, ask your nurse or doctor.          STOP taking these medications    nitrofurantoin  (macrocrystal-monohydrate) 100 MG capsule Commonly known as: MACROBID        TAKE  these medications    amLODipine 10 MG tablet Commonly known as: NORVASC TAKE 1 TABLET BY MOUTH EVERY DAY FOR 30 DAYS for 90 days   erythromycin 250 MG EC tablet Commonly known as: ERY-TAB Take 250 mg by mouth 3 (three) times daily.   estradiol  0.1 MG/GM vaginal cream Commonly known as: ESTRACE  Estrogen Cream Instruction Discard applicator Apply pea sized amount to tip of finger to urethra before bed. Wash hands well after application. Use every day for 2 weeks and then use Monday, Wednesday and Friday only thereafter   levothyroxine  88 MCG tablet Commonly known as: SYNTHROID  Take 88 mcg by mouth every morning.   LORazepam  0.5 MG tablet Commonly known as: ATIVAN  Take 0.5 mg by mouth daily as needed. For anxiety   metFORMIN  1000 MG tablet Commonly known as: GLUCOPHAGE  Take 1,000 mg by mouth 2 (two) times daily.   omeprazole 40 MG capsule Commonly known as: PRILOSEC TAKE 1 CAPSULE BY MOUTH 30 MINUTES BEFORE MORNING MEAL EVERY DAY FOR 30 DAYS for 90   PARoxetine  40 MG tablet Commonly known as: PAXIL  Take 40 mg by mouth daily.   pioglitazone  45 MG tablet Commonly known as: ACTOS  Take 45 mg by mouth daily.   pravastatin 40 MG tablet Commonly known as: PRAVACHOL TAKE 1 TABLET BY MOUTH EVERY DAY for 90 days   telmisartan 80 MG tablet Commonly known as: MICARDIS Take 80  mg by mouth daily.   Trulicity 1.5 MG/0.5ML Soaj Generic drug: Dulaglutide Inject 1.5 mg into the skin once a week.        Allergies:  Allergies  Allergen Reactions   Lipitor [Atorvastatin]     Body aches    Ramipril Cough    Family History: No family history on file.  Social History:   reports that she has never smoked. She has never used smokeless tobacco. She reports current alcohol use. She reports that she does not use drugs.  Physical Exam: BP (!) 143/84   Pulse 66   Ht 5' 4 (1.626 m)   Wt 208 lb 1 oz (94.4 kg)   BMI 35.71 kg/m   Constitutional:  Alert and oriented, no acute  distress, nontoxic appearing HEENT: [place], AT Cardiovascular: No clubbing, cyanosis, or edema Respiratory: Normal respiratory effort, no increased work of breathing Skin: No rashes, bruises or suspicious lesions Neurologic: Grossly intact, no focal deficits, moving all 4 extremities Psychiatric: Normal mood and affect  Laboratory Data: Results for orders placed or performed in visit on 07/28/23  Microscopic Examination   Collection Time: 07/28/23  1:35 PM   Urine  Result Value Ref Range   WBC, UA 11-30 (A) 0 - 5 /hpf   RBC, Urine 0-2 0 - 2 /hpf   Epithelial Cells (non renal) 0-10 0 - 10 /hpf   Bacteria, UA Many (A) None seen/Few  Urinalysis, Complete   Collection Time: 07/28/23  1:35 PM  Result Value Ref Range   Specific Gravity, UA 1.010 1.005 - 1.030   pH, UA 6.5 5.0 - 7.5   Color, UA Yellow Yellow   Appearance Ur Clear Clear   Leukocytes,UA Trace (A) Negative   Protein,UA Negative Negative/Trace   Glucose, UA Negative Negative   Ketones, UA Negative Negative   RBC, UA Negative Negative   Bilirubin, UA Negative Negative   Urobilinogen, Ur 0.2 0.2 - 1.0 mg/dL   Nitrite, UA Positive (A) Negative   Microscopic Examination See below:    Assessment & Plan:   1. Dysuria (Primary) Cath UA appears grossly infected today, will start empiric cefuroxime  and send for culture for further evaluation.  I encouraged her to continue her recurrent UTI prevention regimen as above. - Urinalysis, Complete - CULTURE, URINE COMPREHENSIVE - cefUROXime  (CEFTIN ) 250 MG tablet; Take 1 tablet (250 mg total) by mouth 2 (two) times daily with a meal for 7 days.  Dispense: 14 tablet; Refill: 0   Return if symptoms worsen or fail to improve.  Lucie Hones, PA-C  [facility] [place] [address] [place], [place] [address] [phone]

## 2023-07-30 LAB — CULTURE, URINE COMPREHENSIVE

## 2023-08-08 DIAGNOSIS — K5900 Constipation, unspecified: Secondary | ICD-10-CM | POA: Diagnosis not present

## 2023-08-08 DIAGNOSIS — K259 Gastric ulcer, unspecified as acute or chronic, without hemorrhage or perforation: Secondary | ICD-10-CM | POA: Diagnosis not present

## 2023-08-08 DIAGNOSIS — Z862 Personal history of diseases of the blood and blood-forming organs and certain disorders involving the immune mechanism: Secondary | ICD-10-CM | POA: Diagnosis not present

## 2023-08-10 DIAGNOSIS — R300 Dysuria: Secondary | ICD-10-CM | POA: Diagnosis not present

## 2023-08-10 DIAGNOSIS — E039 Hypothyroidism, unspecified: Secondary | ICD-10-CM | POA: Diagnosis not present

## 2023-08-10 DIAGNOSIS — Z87440 Personal history of urinary (tract) infections: Secondary | ICD-10-CM | POA: Diagnosis not present

## 2023-08-10 DIAGNOSIS — Z862 Personal history of diseases of the blood and blood-forming organs and certain disorders involving the immune mechanism: Secondary | ICD-10-CM | POA: Diagnosis not present

## 2023-08-10 DIAGNOSIS — I10 Essential (primary) hypertension: Secondary | ICD-10-CM | POA: Diagnosis not present

## 2023-08-10 DIAGNOSIS — E782 Mixed hyperlipidemia: Secondary | ICD-10-CM | POA: Diagnosis not present

## 2023-08-10 DIAGNOSIS — E1169 Type 2 diabetes mellitus with other specified complication: Secondary | ICD-10-CM | POA: Diagnosis not present

## 2023-08-10 DIAGNOSIS — N1831 Chronic kidney disease, stage 3a: Secondary | ICD-10-CM | POA: Diagnosis not present

## 2023-08-10 DIAGNOSIS — K259 Gastric ulcer, unspecified as acute or chronic, without hemorrhage or perforation: Secondary | ICD-10-CM | POA: Diagnosis not present

## 2023-08-11 DIAGNOSIS — E119 Type 2 diabetes mellitus without complications: Secondary | ICD-10-CM | POA: Diagnosis not present

## 2023-08-11 DIAGNOSIS — Z7984 Long term (current) use of oral hypoglycemic drugs: Secondary | ICD-10-CM | POA: Diagnosis not present

## 2023-08-11 DIAGNOSIS — H35373 Puckering of macula, bilateral: Secondary | ICD-10-CM | POA: Diagnosis not present

## 2023-08-22 DIAGNOSIS — H35373 Puckering of macula, bilateral: Secondary | ICD-10-CM | POA: Diagnosis not present

## 2023-09-29 ENCOUNTER — Ambulatory Visit (INDEPENDENT_AMBULATORY_CARE_PROVIDER_SITE_OTHER): Admitting: Physician Assistant

## 2023-09-29 VITALS — BP 120/75 | HR 80 | Ht 66.0 in | Wt 202.0 lb

## 2023-09-29 DIAGNOSIS — N390 Urinary tract infection, site not specified: Secondary | ICD-10-CM | POA: Diagnosis not present

## 2023-09-29 LAB — URINALYSIS, COMPLETE
Bilirubin, UA: NEGATIVE
Glucose, UA: NEGATIVE
Ketones, UA: NEGATIVE
Nitrite, UA: POSITIVE — AB
Protein,UA: NEGATIVE
RBC, UA: NEGATIVE
Specific Gravity, UA: 1.01 (ref 1.005–1.030)
Urobilinogen, Ur: 0.2 mg/dL (ref 0.2–1.0)
pH, UA: 5.5 (ref 5.0–7.5)

## 2023-09-29 LAB — MICROSCOPIC EXAMINATION

## 2023-09-29 LAB — BLADDER SCAN AMB NON-IMAGING: Scan Result: 14

## 2023-09-29 MED ORDER — CEFDINIR 300 MG PO CAPS: 300.0000 mg | ORAL_CAPSULE | Freq: Two times a day (BID) | ORAL | 0 refills | Status: AC

## 2023-09-29 NOTE — Progress Notes (Signed)
 09/29/2023 3:54 PM   Sharon Goodman 1946/02/27 [id number]  CC: Chief Complaint  Patient presents with   Urinary Urgency   HPI: Sharon Goodman is a 78 y.o. female with PMH recurrent UTI on topical vaginal estrogen cream, d-mannose, and cranberry supplements who presents today for evaluation of possible UTI.   I saw her in clinic most recently on 07/28/2023 for the same.  I started her on empiric cefuroxime when her UA appeared grossly infected and her culture grew MDR Klebsiella pneumoniae.  Today she reports she does not think her infection never fully resolved.  She went to her primary care after her visit with me and was given another course of antibiotics.  She noticed recurrence of weakness, malaise, and fatigue 2 days ago and has noticed urinary urgency.  She denies fever or vomiting.  She has not taken any medication to treat her symptoms.  In-office UA today positive for nitrites and 1+ leukocytes; urine microscopy with 11-30 WBCs/HPF and many bacteria. PVR 14mL.  PMH: Past Medical History:  Diagnosis Date   Diabetes mellitus    GERD (gastroesophageal reflux disease)    takes prilosec   Hyperlipidemia    Hypertension    pt. doe not have  stress test at dr. Michaelle Copas or Dr. Gardiner Sleeper office   Hypothyroidism    PONV (postoperative nausea and vomiting)     Surgical History: Past Surgical History:  Procedure Laterality Date   CHOLECYSTECTOMY     LUMBAR LAMINECTOMY/DECOMPRESSION MICRODISCECTOMY  06/02/2011   Procedure: LUMBAR LAMINECTOMY/DECOMPRESSION MICRODISCECTOMY;  Surgeon: Clydene Fake;  Location: [facility];  Service: Neurosurgery;  Laterality: Left;  Left L3-4 Laminectomy   NASAL SEPTUM SURGERY      Home Medications:  Allergies as of 09/29/2023       Reactions   Lipitor [atorvastatin]    Body aches    Ramipril Cough        Medication List        Accurate as of September 29, 2023  3:54 PM. If you have any questions, ask your nurse or doctor.           STOP taking these medications    erythromycin 250 MG EC tablet Commonly known as: ERY-TAB       TAKE these medications    amLODipine 10 MG tablet Commonly known as: NORVASC TAKE 1 TABLET BY MOUTH EVERY DAY FOR 30 DAYS for 90 days   estradiol 0.1 MG/GM vaginal cream Commonly known as: ESTRACE Estrogen Cream Instruction Discard applicator Apply pea sized amount to tip of finger to urethra before bed. Wash hands well after application. Use every day for 2 weeks and then use Monday, Wednesday and Friday only thereafter   levothyroxine 88 MCG tablet Commonly known as: SYNTHROID Take 88 mcg by mouth every morning.   LORazepam 0.5 MG tablet Commonly known as: ATIVAN Take 0.5 mg by mouth daily as needed. For anxiety   metFORMIN 1000 MG tablet Commonly known as: GLUCOPHAGE Take 1,000 mg by mouth 2 (two) times daily.   omeprazole 40 MG capsule Commonly known as: PRILOSEC Take by mouth. 2 capsules daily   PARoxetine 40 MG tablet Commonly known as: PAXIL Take 40 mg by mouth daily.   pioglitazone 45 MG tablet Commonly known as: ACTOS Take 45 mg by mouth daily.   pravastatin 40 MG tablet Commonly known as: PRAVACHOL TAKE 1 TABLET BY MOUTH EVERY DAY for 90 days   telmisartan 80 MG tablet Commonly known as: MICARDIS Take  80 mg by mouth daily.   Trulicity 1.5 MG/0.5ML Soaj Generic drug: Dulaglutide Inject 1.5 mg into the skin once a week.        Allergies:  Allergies  Allergen Reactions   Lipitor [Atorvastatin]     Body aches    Ramipril Cough    Family History: No family history on file.  Social History:   reports that she has never smoked. She has never used smokeless tobacco. She reports current alcohol use. She reports that she does not use drugs.  Physical Exam: BP 120/75   Pulse 80   Ht 5\' 6"  (1.676 m)   Wt 202 lb (91.6 kg)   BMI 32.60 kg/m   Constitutional:  Alert and oriented, no acute distress, nontoxic appearing HEENT: [place],  AT Cardiovascular: No clubbing, cyanosis, or edema Respiratory: Normal respiratory effort, no increased work of breathing Skin: No rashes, bruises or suspicious lesions Neurologic: Grossly intact, no focal deficits, moving all 4 extremities Psychiatric: Normal mood and affect  Laboratory Data: Results for orders placed or performed in visit on 09/29/23  Microscopic Examination   Collection Time: 09/29/23  3:43 PM   Urine  Result Value Ref Range   WBC, UA 11-30 (A) 0 - 5 /hpf   RBC, Urine 0-2 0 - 2 /hpf   Epithelial Cells (non renal) 0-10 0 - 10 /hpf   Bacteria, UA Many (A) None seen/Few  Urinalysis, Complete   Collection Time: 09/29/23  3:43 PM  Result Value Ref Range   Specific Gravity, UA 1.010 1.005 - 1.030   pH, UA 5.5 5.0 - 7.5   Color, UA Yellow Yellow   Appearance Ur Cloudy (A) Clear   Leukocytes,UA 1+ (A) Negative   Protein,UA Negative Negative/Trace   Glucose, UA Negative Negative   Ketones, UA Negative Negative   RBC, UA Negative Negative   Bilirubin, UA Negative Negative   Urobilinogen, Ur 0.2 0.2 - 1.0 mg/dL   Nitrite, UA Positive (A) Negative   Microscopic Examination See below:   BLADDER SCAN AMB NON-IMAGING   Collection Time: 09/29/23  3:46 PM  Result Value Ref Range   Scan Result 14 ml    Assessment & Plan:   1. Recurrent UTI (Primary) UA again appears grossly infected, will start empiric Omnicef with a 10-day course and send for culture for further evaluation.  Admittedly, her symptoms are a bit more atypical with more malaise and fatigue than true dysuria.  Will also obtain a renal ultrasound and consider cystoscopy per her clinical course.  She wants to recheck her urine to make sure her UTI clears up, so we will schedule a lab visit in about a week. - Urinalysis, Complete - BLADDER SCAN AMB NON-IMAGING - CULTURE, URINE COMPREHENSIVE - Ultrasound renal complete; Future - cefdinir (OMNICEF) 300 MG capsule; Take 1 capsule (300 mg total) by mouth 2 (two)  times daily for 10 days.  Dispense: 20 capsule; Refill: 0   Return in about 1 week (around 10/06/2023) for Lab visit for UA.  Carman Ching, PA-C  [facility] [place] [address], [address] [place], [place] [address] [phone]

## 2023-10-02 ENCOUNTER — Other Ambulatory Visit: Payer: Self-pay

## 2023-10-02 DIAGNOSIS — N390 Urinary tract infection, site not specified: Secondary | ICD-10-CM

## 2023-10-03 LAB — CULTURE, URINE COMPREHENSIVE

## 2023-10-06 ENCOUNTER — Other Ambulatory Visit

## 2023-10-06 ENCOUNTER — Ambulatory Visit
Admission: RE | Admit: 2023-10-06 | Discharge: 2023-10-06 | Disposition: A | Discharge status: Home / Self Care | Source: Ambulatory Visit | Attending: Physician Assistant | Admitting: Physician Assistant

## 2023-10-06 DIAGNOSIS — N390 Urinary tract infection, site not specified: Secondary | ICD-10-CM | POA: Diagnosis not present

## 2023-10-06 DIAGNOSIS — N281 Cyst of kidney, acquired: Secondary | ICD-10-CM | POA: Diagnosis not present

## 2023-10-06 LAB — URINALYSIS, COMPLETE
Bilirubin, UA: NEGATIVE
Glucose, UA: NEGATIVE
Ketones, UA: NEGATIVE
Leukocytes,UA: NEGATIVE
Nitrite, UA: NEGATIVE
Protein,UA: NEGATIVE
RBC, UA: NEGATIVE
Specific Gravity, UA: 1.01 (ref 1.005–1.030)
Urobilinogen, Ur: 0.2 mg/dL (ref 0.2–1.0)
pH, UA: 6 (ref 5.0–7.5)

## 2023-10-06 LAB — MICROSCOPIC EXAMINATION: RBC, Urine: NONE SEEN /HPF (ref 0–2)

## 2023-10-27 DIAGNOSIS — N390 Urinary tract infection, site not specified: Secondary | ICD-10-CM | POA: Diagnosis not present

## 2023-10-27 DIAGNOSIS — R399 Unspecified symptoms and signs involving the genitourinary system: Secondary | ICD-10-CM | POA: Diagnosis not present

## 2024-01-04 DIAGNOSIS — M79604 Pain in right leg: Secondary | ICD-10-CM | POA: Diagnosis not present

## 2024-01-04 DIAGNOSIS — M25561 Pain in right knee: Secondary | ICD-10-CM | POA: Diagnosis not present

## 2024-01-04 DIAGNOSIS — M545 Low back pain, unspecified: Secondary | ICD-10-CM | POA: Diagnosis not present

## 2024-02-15 DIAGNOSIS — N1831 Chronic kidney disease, stage 3a: Secondary | ICD-10-CM | POA: Diagnosis not present

## 2024-02-15 DIAGNOSIS — I10 Essential (primary) hypertension: Secondary | ICD-10-CM | POA: Diagnosis not present

## 2024-02-15 DIAGNOSIS — E1169 Type 2 diabetes mellitus with other specified complication: Secondary | ICD-10-CM | POA: Diagnosis not present

## 2024-02-15 DIAGNOSIS — R634 Abnormal weight loss: Secondary | ICD-10-CM | POA: Diagnosis not present

## 2024-02-15 DIAGNOSIS — Z0000 Encounter for general adult medical examination without abnormal findings: Secondary | ICD-10-CM | POA: Diagnosis not present

## 2024-02-15 DIAGNOSIS — E782 Mixed hyperlipidemia: Secondary | ICD-10-CM | POA: Diagnosis not present

## 2024-02-15 DIAGNOSIS — N390 Urinary tract infection, site not specified: Secondary | ICD-10-CM | POA: Diagnosis not present

## 2024-02-15 DIAGNOSIS — Z862 Personal history of diseases of the blood and blood-forming organs and certain disorders involving the immune mechanism: Secondary | ICD-10-CM | POA: Diagnosis not present

## 2024-02-15 DIAGNOSIS — M159 Polyosteoarthritis, unspecified: Secondary | ICD-10-CM | POA: Diagnosis not present

## 2024-02-15 DIAGNOSIS — E039 Hypothyroidism, unspecified: Secondary | ICD-10-CM | POA: Diagnosis not present

## 2024-02-15 DIAGNOSIS — R300 Dysuria: Secondary | ICD-10-CM | POA: Diagnosis not present

## 2024-02-15 DIAGNOSIS — Z1331 Encounter for screening for depression: Secondary | ICD-10-CM | POA: Diagnosis not present

## 2024-02-23 ENCOUNTER — Other Ambulatory Visit: Payer: Self-pay | Admitting: Internal Medicine

## 2024-02-23 DIAGNOSIS — Z1231 Encounter for screening mammogram for malignant neoplasm of breast: Secondary | ICD-10-CM

## 2024-03-08 ENCOUNTER — Ambulatory Visit

## 2024-03-28 ENCOUNTER — Ambulatory Visit

## 2024-04-04 DIAGNOSIS — Z79899 Other long term (current) drug therapy: Secondary | ICD-10-CM | POA: Diagnosis not present

## 2024-04-05 DIAGNOSIS — R350 Frequency of micturition: Secondary | ICD-10-CM | POA: Diagnosis not present

## 2024-04-05 DIAGNOSIS — R351 Nocturia: Secondary | ICD-10-CM | POA: Diagnosis not present

## 2024-04-05 DIAGNOSIS — N3021 Other chronic cystitis with hematuria: Secondary | ICD-10-CM | POA: Diagnosis not present

## 2024-04-05 DIAGNOSIS — R8271 Bacteriuria: Secondary | ICD-10-CM | POA: Diagnosis not present

## 2024-04-08 DIAGNOSIS — M25561 Pain in right knee: Secondary | ICD-10-CM | POA: Diagnosis not present

## 2024-04-08 DIAGNOSIS — M7061 Trochanteric bursitis, right hip: Secondary | ICD-10-CM | POA: Diagnosis not present

## 2024-04-11 ENCOUNTER — Ambulatory Visit

## 2024-04-20 DIAGNOSIS — Z23 Encounter for immunization: Secondary | ICD-10-CM | POA: Diagnosis not present

## 2024-04-25 ENCOUNTER — Ambulatory Visit
Admission: RE | Admit: 2024-04-25 | Discharge: 2024-04-25 | Disposition: A | Source: Ambulatory Visit | Attending: Internal Medicine | Admitting: Internal Medicine

## 2024-04-25 DIAGNOSIS — Z1231 Encounter for screening mammogram for malignant neoplasm of breast: Secondary | ICD-10-CM

## 2024-06-11 DIAGNOSIS — M47816 Spondylosis without myelopathy or radiculopathy, lumbar region: Secondary | ICD-10-CM | POA: Diagnosis not present

## 2024-06-11 DIAGNOSIS — M1611 Unilateral primary osteoarthritis, right hip: Secondary | ICD-10-CM | POA: Diagnosis not present

## 2024-06-11 DIAGNOSIS — G8929 Other chronic pain: Secondary | ICD-10-CM | POA: Diagnosis not present

## 2024-06-11 DIAGNOSIS — M5441 Lumbago with sciatica, right side: Secondary | ICD-10-CM | POA: Diagnosis not present

## 2024-06-24 DIAGNOSIS — M25551 Pain in right hip: Secondary | ICD-10-CM | POA: Diagnosis not present

## 2024-06-24 DIAGNOSIS — M1611 Unilateral primary osteoarthritis, right hip: Secondary | ICD-10-CM | POA: Diagnosis not present

## 2024-07-05 DIAGNOSIS — N3020 Other chronic cystitis without hematuria: Secondary | ICD-10-CM | POA: Diagnosis not present
